# Patient Record
Sex: Female | Born: 1940 | Race: White | Hispanic: No | Marital: Married | Smoking: Never smoker
Health system: Southern US, Community
[De-identification: ages and names within clinical notes are randomized; demographics above are authoritative.]

## PROBLEM LIST (undated history)

## (undated) DIAGNOSIS — M48061 Spinal stenosis, lumbar region without neurogenic claudication: Secondary | ICD-10-CM

## (undated) DIAGNOSIS — M797 Fibromyalgia: Secondary | ICD-10-CM

## (undated) DIAGNOSIS — I10 Essential (primary) hypertension: Secondary | ICD-10-CM

## (undated) DIAGNOSIS — R413 Other amnesia: Secondary | ICD-10-CM

## (undated) HISTORY — PX: LUMBAR SPINE SURGERY: SHX701

## (undated) HISTORY — PX: CERVICAL SPINE SURGERY: SHX589

---

## 1998-11-09 ENCOUNTER — Encounter: Admission: RE | Admit: 1998-11-09 | Discharge: 1998-11-09 | Payer: Self-pay | Admitting: Neurological Surgery

## 1998-11-09 ENCOUNTER — Encounter: Payer: Self-pay | Admitting: Neurological Surgery

## 1998-11-18 ENCOUNTER — Encounter: Payer: Self-pay | Admitting: Neurological Surgery

## 1998-11-22 ENCOUNTER — Inpatient Hospital Stay (HOSPITAL_COMMUNITY): Admission: RE | Admit: 1998-11-22 | Discharge: 1998-11-27 | Payer: Self-pay | Admitting: Neurological Surgery

## 1998-11-22 ENCOUNTER — Encounter: Payer: Self-pay | Admitting: Neurological Surgery

## 1999-03-15 ENCOUNTER — Encounter: Payer: Self-pay | Admitting: Neurological Surgery

## 1999-03-15 ENCOUNTER — Ambulatory Visit (HOSPITAL_COMMUNITY): Admission: RE | Admit: 1999-03-15 | Discharge: 1999-03-15 | Payer: Self-pay | Admitting: Neurological Surgery

## 1999-05-04 ENCOUNTER — Encounter: Admission: RE | Admit: 1999-05-04 | Discharge: 1999-06-29 | Payer: Self-pay | Admitting: Neurological Surgery

## 2002-01-21 ENCOUNTER — Ambulatory Visit (HOSPITAL_COMMUNITY): Admission: RE | Admit: 2002-01-21 | Discharge: 2002-01-21 | Payer: Self-pay | Admitting: Family Medicine

## 2002-01-21 ENCOUNTER — Encounter: Payer: Self-pay | Admitting: Family Medicine

## 2003-07-08 ENCOUNTER — Ambulatory Visit (HOSPITAL_COMMUNITY): Admission: RE | Admit: 2003-07-08 | Discharge: 2003-07-08 | Payer: Self-pay | Admitting: Gastroenterology

## 2004-02-23 ENCOUNTER — Ambulatory Visit (HOSPITAL_COMMUNITY): Admission: RE | Admit: 2004-02-23 | Discharge: 2004-02-23 | Payer: Self-pay | Admitting: Family Medicine

## 2004-10-19 ENCOUNTER — Encounter: Admission: RE | Admit: 2004-10-19 | Discharge: 2004-10-19 | Payer: Self-pay | Admitting: Orthopaedic Surgery

## 2004-11-29 ENCOUNTER — Inpatient Hospital Stay (HOSPITAL_COMMUNITY): Admission: RE | Admit: 2004-11-29 | Discharge: 2004-12-04 | Payer: Self-pay | Admitting: Orthopaedic Surgery

## 2006-08-23 ENCOUNTER — Encounter: Admission: RE | Admit: 2006-08-23 | Discharge: 2006-08-23 | Payer: Self-pay | Admitting: Obstetrics and Gynecology

## 2006-09-06 ENCOUNTER — Encounter: Admission: RE | Admit: 2006-09-06 | Discharge: 2006-09-06 | Payer: Self-pay | Admitting: Obstetrics and Gynecology

## 2006-09-28 ENCOUNTER — Inpatient Hospital Stay (HOSPITAL_COMMUNITY): Admission: EM | Admit: 2006-09-28 | Discharge: 2006-10-01 | Payer: Self-pay | Admitting: Emergency Medicine

## 2007-04-02 ENCOUNTER — Encounter: Admission: RE | Admit: 2007-04-02 | Discharge: 2007-04-02 | Payer: Self-pay | Admitting: Family Medicine

## 2007-11-06 ENCOUNTER — Encounter: Admission: RE | Admit: 2007-11-06 | Discharge: 2007-11-06 | Payer: Self-pay | Admitting: Obstetrics and Gynecology

## 2007-11-11 ENCOUNTER — Ambulatory Visit (HOSPITAL_BASED_OUTPATIENT_CLINIC_OR_DEPARTMENT_OTHER): Admission: RE | Admit: 2007-11-11 | Discharge: 2007-11-11 | Payer: Self-pay | Admitting: Orthopedic Surgery

## 2007-11-11 ENCOUNTER — Encounter (INDEPENDENT_AMBULATORY_CARE_PROVIDER_SITE_OTHER): Payer: Self-pay | Admitting: Orthopedic Surgery

## 2007-12-29 ENCOUNTER — Inpatient Hospital Stay (HOSPITAL_COMMUNITY): Admission: RE | Admit: 2007-12-29 | Discharge: 2007-12-30 | Payer: Self-pay | Admitting: Orthopaedic Surgery

## 2008-01-09 ENCOUNTER — Encounter: Admission: RE | Admit: 2008-01-09 | Discharge: 2008-01-09 | Payer: Self-pay | Admitting: Orthopaedic Surgery

## 2008-01-11 ENCOUNTER — Emergency Department (HOSPITAL_COMMUNITY): Admission: EM | Admit: 2008-01-11 | Discharge: 2008-01-11 | Payer: Self-pay | Admitting: Emergency Medicine

## 2008-08-06 ENCOUNTER — Encounter: Admission: RE | Admit: 2008-08-06 | Discharge: 2008-08-06 | Payer: Self-pay | Admitting: Orthopaedic Surgery

## 2008-12-09 ENCOUNTER — Encounter: Admission: RE | Admit: 2008-12-09 | Discharge: 2008-12-09 | Payer: Self-pay | Admitting: Obstetrics and Gynecology

## 2010-01-22 ENCOUNTER — Encounter: Payer: Self-pay | Admitting: Family Medicine

## 2010-02-03 ENCOUNTER — Other Ambulatory Visit: Payer: Self-pay | Admitting: Neurological Surgery

## 2010-02-03 DIAGNOSIS — M542 Cervicalgia: Secondary | ICD-10-CM

## 2010-02-03 DIAGNOSIS — M79603 Pain in arm, unspecified: Secondary | ICD-10-CM

## 2010-02-07 ENCOUNTER — Other Ambulatory Visit: Payer: Self-pay

## 2010-02-08 ENCOUNTER — Other Ambulatory Visit: Payer: Self-pay

## 2010-02-23 ENCOUNTER — Ambulatory Visit
Admission: RE | Admit: 2010-02-23 | Discharge: 2010-02-23 | Disposition: A | Discharge status: Home / Self Care | Payer: MEDICARE | Source: Ambulatory Visit | Attending: Neurological Surgery | Admitting: Neurological Surgery

## 2010-02-23 DIAGNOSIS — M542 Cervicalgia: Secondary | ICD-10-CM

## 2010-02-23 DIAGNOSIS — M79603 Pain in arm, unspecified: Secondary | ICD-10-CM

## 2010-03-20 ENCOUNTER — Other Ambulatory Visit: Payer: Self-pay | Admitting: *Deleted

## 2010-03-20 DIAGNOSIS — Z1231 Encounter for screening mammogram for malignant neoplasm of breast: Secondary | ICD-10-CM

## 2010-04-05 ENCOUNTER — Ambulatory Visit
Admission: RE | Admit: 2010-04-05 | Discharge: 2010-04-05 | Disposition: A | Discharge status: Home / Self Care | Payer: MEDICARE | Source: Ambulatory Visit | Attending: *Deleted | Admitting: *Deleted

## 2010-04-05 DIAGNOSIS — Z1231 Encounter for screening mammogram for malignant neoplasm of breast: Secondary | ICD-10-CM

## 2010-04-17 LAB — URINALYSIS, ROUTINE W REFLEX MICROSCOPIC
Bilirubin Urine: NEGATIVE
Glucose, UA: NEGATIVE mg/dL
Ketones, ur: NEGATIVE mg/dL
Nitrite: NEGATIVE
Protein, ur: 30 mg/dL — AB
Specific Gravity, Urine: 1.008 (ref 1.005–1.030)
Urobilinogen, UA: 1 mg/dL (ref 0.0–1.0)
pH: 8 (ref 5.0–8.0)

## 2010-04-17 LAB — CBC
MCHC: 33.5 g/dL (ref 30.0–36.0)
Platelets: 280 10*3/uL (ref 150–400)
RBC: 3.89 MIL/uL (ref 3.87–5.11)
WBC: 6 10*3/uL (ref 4.0–10.5)

## 2010-04-17 LAB — URINE CULTURE: Colony Count: >=100000

## 2010-04-17 LAB — DIFFERENTIAL
Basophils Relative: 0 % (ref 0–1)
Eosinophils Absolute: 0.1 10*3/uL (ref 0.0–0.7)
Monocytes Relative: 9 % (ref 3–12)
Neutro Abs: 4 10*3/uL (ref 1.7–7.7)
Neutrophils Relative %: 68 % (ref 43–77)

## 2010-04-17 LAB — URINE MICROSCOPIC-ADD ON

## 2010-04-17 LAB — BASIC METABOLIC PANEL
BUN: 15 mg/dL (ref 6–23)
CO2: 24 mEq/L (ref 19–32)
Calcium: 9.1 mg/dL (ref 8.4–10.5)
Creatinine, Ser: 0.59 mg/dL (ref 0.4–1.2)
GFR calc Af Amer: >60 mL/min (ref >60)

## 2010-05-16 NOTE — Op Note (Signed)
Sarah [name], Sarah [name]              ACCOUNT NO.:  [id number]   MEDICAL RECORD NO.:  [id number]          PATIENT TYPE:  INP   LOCATION:  3030                         FACILITY:  [facility]   PHYSICIAN:  Mark C. Ophelia Charter, M.D.    DATE OF BIRTH:  1940-11-11   DATE OF PROCEDURE:  12/29/2007  DATE OF DISCHARGE:                               OPERATIVE REPORT   PREOPERATIVE DIAGNOSIS:  Cervical spondylosis.   POSTOPERATIVE DIAGNOSIS:  Cervical spondylosis.   PROCEDURE:  C4-5, C5-6, C6-7 anterior cervical diskectomy and fusion,  allograft and plate.  VueLock 50-mm plate.   SURGEON:  Mark C. Ophelia Charter, MD   ASSISTANT:  Wende Neighbors, PA-C   ANESTHESIA:  GOT plus Marcaine skin local.   BRIEF HISTORY:  This 70 year old female has had extensive history of  degenerative disk disease, had previous lumbar TLIF procedure, and has  had progressive cervical spondylosis.  She had had left hand numbness  which has been persistent for months with gradual progression.  She  normally takes Ultram 2 tablets 3-4 times a day plus Neurontin.  She had  progressive left hand numbness and chronic neck pain with more pain on  the left than right.  Over the Christmas holiday, she had had increased  numbness to the left long finger.  The patient had multilevel  spondylitic changes with significant facet degeneration with the most  narrowing on the left at C4-C5 and C6-C7.   PROCEDURE:  After induction of general anesthesia, orotracheal  intubation, surgical time-out, checklist of procedure, preoperative  Ancef prophylaxis, standard prepping of the neck was performed after  arms were tucked at the sides with careful padding of the ulnar nerves.  Head halter traction had been applied with no weight and sterile skin  marker was used prior to Betadine and Vi-Drape application after the  area was squared with towels, sterile Mayo stand to the head, thyroid  sheet and drape.  Incision was planned directly over the C5-6  level.  Looking at the MRI scan in the operating room, the spondylosis showed  narrowing on the left at C6-7.  Incision was made starting at the  midline and extending to the left platysma.  It was divided in line with  the fibers.  Blunt dissection down to the prominent large spur of C5-6  was selected and the needle was placed in it.  With the patient having  narrowing at C6-7 on the left with increased symptoms, x-ray using the C-  arm confirmed that spot film with a short 25 needle was directed at the  C5-6 level.  Spurs were removed.  The patient had significant  spondylosis at C5-6 the most severe with foraminal narrowing.  C6 level  was done first with her left long finger symptoms and a diskectomy was  performed at this level.  There were minimal anterior spurs at this  level.  Diskectomy was performed.  Operative microscope was draped and  brought in and posterior and longitudinal ligament was taken down.  Cloward curettes were used to remove bone out the uncovertebral joints  on each side.  There  was foraminal narrowing most significant on the  left side with spurs removed which was causing nerve root compression.  Sizing showed the 6-mm bone graft was appropriate.  The teeth Cloward  retractors were removed from right and left and blades were placed at  the C5-6 level next.  Spurs were removed anteriorly so plate would sit  down and flush.  Diskectomy was performed and the operative microscope  was brought back in and significant spurs were removed.  There was about  a 1-mm space posteriorly.  At this level, the bur was used to remove  within the bone and then it was removed with 1-mm Kerrison.  There was  significant spurring on both right and left and part of the posterior  longitudinal ligament was calcified causing foraminal stenosis and this  had to be stripped off of the vertebrae for decompression.  Once the  dura was directly visualized all the way across, the interspace  sizing  showed that 6-mm graft was appropriate.  The endplates had been removed  using the Karlin curettes by hand, rasping, and then sizing 7 was too  tight, 6 gave a nice tight fit.  It was a cortical cancellous lordotic  graft also.  Holes were drilled for planned plate.  It was noted at this  time that, I discussed with the patient that C4-5 was the planned level  in addition to the C5-6.  The patient's preoperative symptoms on the  left and MRI findings with narrowing at C6-C7 as well as C4-C5, it was  felt best to include C4-C5 level as well and the 3-level fusion was  performed rather than the planned 2-level fusion.  Operative microscope  was brought in for this level and rather than a 32-mm plate, a 04-VW  plate was selected.  There was foraminal stenosis more on the left than  right at the C4-5 level, but certainly not as severe as the C5-6 level.  After irrigation with saline solution, trial sizers showed again a 6-mm  lordotic graft gave a tight fit.  It was placed and bone grafts at each  level were rehydrated and syringe tapping, removing the air, marking it  with a skin marker in the midline to make sure the graft was put in a  good position.  After placement, a third graft plate was applied using  the same screw holes in the bottom 3 vertebrae and 2 new drill holed in  C4 vertebra.  Spot fluoro pictures were taken showing good position on  the AP and good position laterally.  Wound closure was performed with  Hemovac placed through separate stab incision in-line with the skin  incision.  Using the trocar, the platysma was closed with 3-0 Vicryl and  4-0 Vicryl subcuticular closure.  Tincture of benzoin, Steri-Strips,  postop dressing, and soft cervical collar was applied.  The patient  tolerated the procedure well and was transferred to the recovery room in  stable condition.  Foley catheter was inserted at the end of the case  just prior to extubation.  The patient was  neurologically intact in the  recovery room.      Mark C. Ophelia Charter, M.D.  Electronically Signed     MCY/MEDQ  D:  12/29/2007  T:  12/30/2007  Job:  [id number]

## 2010-05-16 NOTE — Cardiovascular Report (Signed)
NAME:  Sarah [name] NO.:  [id number]   MEDICAL RECORD NO.:  [id number]          PATIENT TYPE:  OBV   LOCATION:  6531                         FACILITY:  [facility]   PHYSICIAN:  Francisca December, M.D.  DATE OF BIRTH:  1940/01/30   DATE OF PROCEDURE:  DATE OF DISCHARGE:                            CARDIAC CATHETERIZATION   PROCEDURES PERFORMED:  1. Left heart catheterization.  2. Left ventriculogram.  3. Coronary angiography.  4. Intravascular ultrasound.  5. Percutaneous coronary intervention/drug-eluting stent implantation,      mid anterior descending artery.   INDICATIONS:  Sarah [name] is a 70 year old woman who had a prolonged  episode of unstable angina.  The CK-MBs were mildly elevated.  She has  an abnormal EKG with inferior Q waves present.  She has multiple risk  factors for coronary heart disease.  She is brought to the  catheterization laboratory at this time to identify the extent of  disease and provide further therapeutic options.   PROCEDURAL NOTE:  The patient was brought to cardiac catheterization  laboratory in the fasting state.  The right groin was prepped and draped  in the usual sterile fashion.  Local anesthesia was obtained with the  infiltration of 1% lidocaine.  A 6-French catheter sheath was inserted  percutaneously into the right femoral artery utilizing an anterior  approach over a guiding J-wire.  Left heart catheterization and coronary  angiography then proceeded in the standard fashion using 6-French #4  left and right Judkins catheters and a 110-cm pigtail catheter.  A 30-  degree RAO cine left ventriculogram was performed utilizing a power  injector,  mL were injected at 12 mL/sec.   I then proceeded with intravascular ultrasound.  The patient received a  total of 4500 units of heparin.  A 6-French #4 left Judkins guiding  catheter was advanced to the ascending aorta, where the left coronary os  was engaged.  A 0.014-inch  Luge intracoronary guidewire was advanced  into a diagonal branch initially.  Intravascular ultrasound was  performed with the Scimed Atlantis catheter.  A single mechanical  pullback was obtained.  The wire was then withdrawn and advanced into  the anterior descending artery.  Intravascular ultrasound again was  obtained with a mechanical pullback device.  The guidewire and  ultrasound catheter were then removed and the images analyzed.  I then  elected to proceed with intracoronary intervention.  The patient  received a double bolus and constant infusion of Integrilin.  The  resultant ACT was 331 seconds.  The guiding catheter was exchanged for a  7-French #3.5 CLS catheter.  A 0.014-inch Luge was advanced into the  diagonal branch.  A Whisper wire was advanced down the LAD.  Initial  dilatation of the diagonal branch at the ostium was performed with am  AngioSculpt PTCA catheter.  This was a 2.5/15-mm device.  It was  carefully positioned and inflated to 8 atmospheres for 55 seconds.  This  balloon was deflated and removed and a 2.0/20 mm Taxus Express II  intracoronary stent was advanced into position in the anterior  descending artery  and inflated there to a peak pressure of 14  atmospheres for 28 seconds.  This did cover the ostium of the diagonal  branch.  The diagonal wire was withdrawn and then advanced back into the  diagonal through the stent strut.  Balloon dilatation was performed  there with a 2.5/15 mm Scimed Maverick intracoronary balloon.  This was  positioned in the ostium and inflated to 6 atmospheres for 60 seconds.  This balloon was deflated and removed and a 3.0/15 mm Quantum Maverick  was advanced down the anterior descending artery.  It was deflated in a  single position to 12 atmospheres for 61 seconds.  Finally a kissing  balloon maneuver was performed with the 2.5/15 mm Maverick balloon in  the diagonal branch and the Quantum Maverick in the LAD.  The Maverick   balloon in the diagonal was inflated to 6 atmospheres in the Quantum  inflated to 9 atmospheres.  Following these maneuvers, there was  excellent patency in the LAD and excellent patency in the diagonal  branch.  A small, non-propagating superior dissection of the diagonal  branch at the ostium was seen at completion.  There was a good step-up  and step-down in the stented segment of the anterior descending artery.   The guiding catheter and guidewires were removed.  A right femoral  arteriogram in the 45-degree RAO angulation via the catheter sheath by  hand injection documented adequate anatomy for placement of percutaneous  closure device Angio-Seal.  This was subsequently deployed with good  hemostasis and an intact distal pulse.   HEMODYNAMICS:  Systemic arterial pressure was 136/67 with a mean of 95  mmHg.  There was no systolic gradient across the aortic valve.  The left  ventricular end-diastolic pressure was 8 mmHg pre ventriculogram.   ANGIOGRAPHY:  The left ventriculogram demonstrated normal chamber size  and normal global systolic function without regional wall motion  abnormality.  Ventricular tachycardia was induced during the injection.  A visual estimate of the ejection fraction is 65%.  There is left  coronary calcification seen.  No significant mitral regurgitation is  present.  The aortic valve is trileaflet and opened normally during  systole.   There is a right-dominant coronary system present.  The left main  coronary artery was normal.   The left anterior descending artery and its branches were highly  diseased; there was an eccentric stenosis at the origin of the diagonal  branch.  The origin of the diagonal branch appeared to be at least 50%  stenotic.  The anterior descending artery itself, especially in the RAO  views, appeared to have a 70% stenosis just after the origin of the  diagonal branch.  The LAD reaches but does not traverse the apex.  The   diagonal branch other than the ostial lesion has no significant  obstruction.   The left circumflex coronary artery and its branches show a ramus  intermedius and a large first marginal branch.  There is then a moderate-  sized obtuse marginal branch.  The ramus intermedius has a 30-50%  stenosis in the proximal segment.  Otherwise, there are no significant  obstructions in the circumflex system.   The right coronary artery is large and dominant.  There is no  obstruction seen in the proximal, mid or distal portion.  There is a  large poster descending artery and that reaches the apex.  There is no  obstruction in the posterior descending artery.  There is a moderate-  sized posterolateral branch with a single, small left ventricular  branch.  No obstruction is seen in this vessel.   Following balloon dilatation and stent implantation in the anterior  descending artery, there was no residual stenosis.  There was a 10-20%  residual stenosis in the diagonal branch ostium.   INTRAVASCULAR ULTRASOUND:  This demonstrated a distal reference vessel  size of 2.5 x 3.0 mm in the anterior descending artery.  The proximal  vessel reference size was 2.4 x 3.1 mm.  The luminal reference size in  the diagonal was 2.2 x 2.6 mm.  At the site of greatest stenosis in the  anterior descending artery, it was 1.4 x 1.9 mm with an area of 2.4 sq.  Mm.   FINAL IMPRESSION:  1. Atherosclerotic coronary vascular disease, single vessel.  2. Status post successful percutaneous coronary intervention/drug-      eluting stent implantation, anterior descending artery, with      cutting balloon dilatation of the diagonal branch ostium.  3. Intact left ventricular size and global systolic function, ejection      fraction 65%.      Francisca December, M.D.  Electronically Signed     JHE/MEDQ  D:  09/30/2006  T:  09/30/2006  Job:  [id number]   cc:   Molly Maduro A. Nicholos Johns, M.D.

## 2010-05-16 NOTE — Cardiovascular Report (Signed)
NAME:  Sarah [name], BEZOLD NO.:  [id number]   MEDICAL RECORD NO.:  [id number]          PATIENT TYPE:  OBV   LOCATION:  3710                         FACILITY:  [facility]   PHYSICIAN:  Francisca December, M.D.  DATE OF BIRTH:  28-Feb-1940   DATE OF PROCEDURE:  DATE OF DISCHARGE:                            CARDIAC CATHETERIZATION   REASON FOR CONSULTATION:  Chest pain.   HISTORY OF PRESENT ILLNESS:  Ms. Leya Paige is a pleasant 70-year-  old woman without prior cardiac history who yesterday had spontaneous  onset of anterior substernal chest pain she describes as rather sharp.  Within a few minutes, it had radiated up into the jaw bilaterally.  She  got up from the desk where she was sitting, took some antacids without  significant relief.  The EMS was called, and by the time she was being  loaded into the ambulance, the pain was starting to resolve.  They did  give her a nitroglycerin and en route, and the pain primarily resolved,  but she did develop quite a headache.  There was no associated nausea or  diaphoresis.  Her daughter said her hands were clammy.  No vomiting.  No  associated shortness of breath.  She has not had pain like this in the  past and has generally not had any symptoms of reflux.   PAST MEDICAL HISTORY:  1. Hypertension.  2. Questionable CVA 2004.  3. Spondylolisthesis.  4. Spinal stenosis status post restorative surgery.  5. Fibromyalgia.   CURRENT MEDICATIONS:  1. Neurontin 600 mg b.i.d.  2. Excedrin 350 mg once daily.  3. Lisinopril 20 mg p.o. daily.  4. Furosemide ?dose once daily.  5. Tramadol 50 mg p.o. t.i.d.  6. Percogesic 325/30 t.i.d.  7. Flexeril 10 mg p.o. daily.  8. Sennosides 25 mg once daily.   DRUG ALLERGIES:  NONE KNOWN.   SOCIAL HISTORY:  The patient is married, has three children.  No alcohol  or tobacco use.  Does not use excessive caffeine.  She is a [employer]  [employer], site.   FAMILY HISTORY:  Significant for coronary  artery disease in a younger  brother at the age less than 43.  Also, her father died of a ruptured  abdominal aortic aneurysm the age of 69.   REVIEW OF SYSTEMS:  Other than chronic low back pain and numbness in the  right leg, as well as symptoms typical for fibromyalgia, this is  negative.   PHYSICAL EXAMINATION:  VITAL SIGNS:  Blood pressure is varying between  90 and 116/50.  It was 170/96, upon initial arrival on the floor.  Heart  rate is 70 and regular, respiratory rate 18, temperature 97.7, O2  saturation on 2 liters is 97%/.  GENERAL:  This is a well-appearing, mildly obese 70 year old woman,  pleasant, conversant, in no distress, well kept.  HEENT:  Unremarkable.  NECK:  Supple without thyromegaly or masses.  The carotid upstrokes are  normal.  There is no bruit.  There is no JVD.  CHEST:  Clear with good excursion bilaterally.  No wheezes, rales, or  rhonchi.  HEART:  Has regular rhythm.  Normal S1 and S2 was heard.  No S3-S4  murmur, click or rub noted.  ABDOMEN:  Flat, soft, nontender, no midline pulsatile mass.  No  hepatomegaly.  LOWER EXTREMITIES:  External genitalia is without lesions.  RECTAL:  Not performed.  EXTREMITIES:  Show full range of motion, no edema, intact distal pulses.  NEUROLOGICAL:  Cranial nerves II-XII are intact.  Motor and sensory  grossly intact.  Gait not tested.  SKIN:  Warm, dry and clear.   ACCESSORY CLINICAL DATA:  Admission hemogram 1 gram shows a mild anemia  at 11.7 grams, hemoglobin otherwise unremarkable.  Creatinine is 0.8.  Initial cardiac markers showed a CK-MB of 3.4, a troponin of less than  0.05.  D-dimer was 0.37.  Subsequent to this, she has had CK-MB and  troponins done in the laboratory which showed initially a CK-MB mildly  elevated 6.6 with a total of 62, troponin 0.02.  The second set CK-MB  was 5.7, total 57, troponin 0.02, and the last set done this morning at  400:  CK-MB of 4, a total of 52 and troponin 0.02.    Electrocardiogram is abnormal and shows a loss of R-wave amplitude from  V2 to V3, suggestive anterior infarct.  She also has inferior Q-waves,  lead II and aVF.  Near diagnostic for inferior infarction.  There is  also left atrial enlargement.   CHEST X-RAY ON ADMISSION:  No acute findings.   IMPRESSION:  1. I believe Ms. Mckoy has experienced an episode of unstable      angina, given radiation of the pain and into the jaws and her      multiple risk factors which include age greater 55 years, strong      family history, hypertension, as a correction.  She also has mildly      elevated CK-MB and an abnormal EKG.  2. Treated hypertension.  3. Fibromyalgia.  4. 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  5. Hypertension on admission.   PLAN:  1. We will add Lovenox to the regimen of aspirin and topical      nitroglycerin.  2. I have consulted her to undergo, and she has accepted cardiac      catheterization, coronary angiography, possible PCI.  Goals, risks      and alternatives were discussed.  The patient states her      understanding, has had her questions answered, and wishes to      proceed.   Thank you very much for allowing me to assist in the care of Nile Dear.  It has been a pleasure to do so.  I will discuss her further  care with you.      Francisca December, M.D.  Electronically Signed     JHE/MEDQ  D:  09/29/2006  T:  09/29/2006  Job:  [id number]   cc:   Molly Maduro A. Nicholos Johns, M.D.

## 2010-05-16 NOTE — Op Note (Signed)
Sarah [name], BUCKALEW NO.:  [id number]   MEDICAL RECORD NO.:  [id number]          PATIENT TYPE:  AMB   LOCATION:  DSC                          FACILITY:  [facility]   PHYSICIAN:  Cindee Salt, M.D.       DATE OF BIRTH:  1940-10-29   DATE OF PROCEDURE:  11/11/2007  DATE OF DISCHARGE:                               OPERATIVE REPORT   PREOPERATIVE DIAGNOSIS:  Stenosing tenosynovitis, left middle finger.   POSTOPERATIVE DIAGNOSIS:  Stenosing tenosynovitis, left middle finger.   OPERATION:  Release of A1 pulley, left middle finger with excision of  volar flexor sheath cyst.   SURGEON:  Cindee Salt, MD   ASSISTANT:  Carolyne Fiscal RN   ANESTHESIA:  Forearm-based IV regional.   ANESTHESIOLOGIST:  Dr. Jean Rosenthal.   HISTORY:  The patient is a 70 year old female with a history of  triggering of the left middle finger.  This has not responded to  conservative treatment.  She was elect to undergo release of the A1  pulley.  She is aware of risks and complications including infection,  recurrence, injury to arteries, nerves, and tendons, incomplete relief  of symptoms, and dystrophy.  Preoperative area, the patient is seen.  The extremity marked by both the patient and surgeon.  Questions were  encouraged and answered.  Antibiotic was given.   PROCEDURE:  The patient was brought to the operating room where a  forearm-based IV regional anesthetic was carried out without difficulty.  She was prepped using DuraPrep, supine position, left arm free.  Time-  out was taken.  After adequate anesthesia was afforded to the patient,  an oblique incision was made over the A1 pulley of left middle finger  carried down through subcutaneous tissue.  Bleeders were  electrocauterized, bipolar.  The dissection carried down to the flexor  sheath.  A cyst was immediately encountered.  This was excised and sent  to pathology.  The A1 pulley was then released in its radial aspect.  A  small incision was made  centrally in the A2 pulley.  No further lesions  were identified.  Finger was placed through a full range of motion, no  further triggering was noted.  The wound was irrigated.  The skin closed  with interrupted 5-0 Vicryl Rapide sutures.  A sterile compressive  dressing was applied.  The patient tolerated the procedure well and was  taken to the recovery room for observation in satisfactory condition.  She will be discharged home and to return to the [facility] in 1 week, on Vicodin.          ______________________________  Cindee Salt, M.D.    GK/MEDQ  D:  11/11/2007  T:  11/11/2007  Job:  [id number]   cc:   Molly Maduro A. Nicholos Johns, M.D.

## 2010-05-16 NOTE — Discharge Summary (Signed)
[name], [name] NO.:  [id number]   MEDICAL RECORD NO.:  [id number]          PATIENT TYPE:  INP   LOCATION:  6531                         FACILITY:  [facility]   PHYSICIAN:  Barnetta Chapel, MDDATE OF BIRTH:  [birth year]   DATE OF ADMISSION:  09/28/2006  DATE OF DISCHARGE:  10/01/2006                               DISCHARGE SUMMARY   PRIMARY CARE PHYSICIAN:  Sarah [name], M.D.   DISCHARGE DIAGNOSES:  1. Chest pain, secondary to acute coronary syndrome.  2. Hypertension.  3. History of fibromyalgia.   DISCHARGE MEDICATIONS:  1. Toprol XL 150 mg p.o. once daily.  2. EC aspirin 325 mg p.o. once daily.  3. Plavix 75 mg p.o. once daily.  4. Sublingual nitroglycerin 0.4 mg p.r.n.  5. Neurontin 650 mg p.o. twice daily.  6. Lisinopril 20 mg once daily.  7. Tramadol 50 mg p.o. t.i.d.  8. Percogesic 325 mg t.i.d.  9. Flexeril 10 mg p.o. once daily.  10.Sennoside 25 mg p.o. once daily.   CONSULTATIONS:  Cardiology consult. The patient was seen by Dr. Amil Amen.  Dr. Amil Amen advised that the patient should have a cardiac  catheterization. Cardiac catheterization was done on September 30, 2006.  80% mid LAD lesion as well as 80% diagonal lesions were found. The mid  LAD lesion was stented. The diagonal lesion was dilated using balloon.   BRIEF HISTORY AND HOSPITAL COURSE:  Please refer to the history and  physical done on September 28, 2006. The patient is a 70 year old female  with a past medical history significant for hypertension, __________  severe, spondylolisthesis, spinal stenosis, status post back surgery and  fibromyalgia. The patient was admitted with chest pain.   The patient was admitted to the telemetry floor. A cardiology consult  was called and the patient was seen by Dr. Amil Amen. Dr. Amil Amen advised  a cardiac cath. The cardiac cath was done on September 30, 2006 which  revealed the above lesions. The patient has remained chest pain free.  She  will be discharged back home on aspirin, Plavix, ACE inhibitor and  beta blockers.   PLAN:  1. Discharge patient home today.  2. Follow with the cardiology team.  3. Follow with the primary care Sarah [name], Dr. Elias [name], in a week.  4. Cardiac diet.  5. Activity as per the cardiologist.      Barnetta Chapel, MD  Electronically Signed     SIO/MEDQ  D:  10/01/2006  T:  10/01/2006  Job:  [id number]   cc:   Sarah [name], M.D.  Sarah [name], M.D.

## 2010-05-16 NOTE — H&P (Signed)
NAME:  Sarah [name], Sarah [name] NO.:  [id number]   MEDICAL RECORD NO.:  [id number]          PATIENT TYPE:  EMS   LOCATION:  MAJO                         FACILITY:  [facility]   PHYSICIAN:  Barnetta Chapel, MDDATE OF BIRTH:  [birth year]   DATE OF ADMISSION:  09/28/2006  DATE OF DISCHARGE:                              HISTORY & PHYSICAL   PRIMARY CARE PHYSICIAN:  Robert A. Nicholos Johns, M.D.   CHIEF COMPLAINT:  Chest pain.   HISTORY OF PRESENT ILLNESS:  The patient is a 70 year old female with  past medical history significant for hypertension, questionable CVA,  spondylolisthesis, spinal stenosis status post back surgery, and  fibromyalgia.  The patient's brother has a history of coronary artery  disease and has had a CABG.  The patient presented with sudden onset of  lower sternal chest pain earlier in the day.  The pain was said to be  sharp-like and was radiating to the jaw.  The pain lasted for about 20  minutes.  The patient took some antiacids and felt much better.  According to the patient's daughter, nitroglycerin also improved the  chest pain.  No associated nausea or vomiting, no shortness of breath  and no diaphoresis.  Cardiac enzymes done so far are within normal  range.  EKG done revealed poor R wave progression and Q waves in leads  III and aVF.  No headache, no neck pain, no GI symptoms, and no urinary  symptoms.  No weight loss.   PAST MEDICAL HISTORY:  Essentially as above.   ALLERGIES:  NONE.   MEDICATIONS PRIOR TO ADMISSION:  (The patient is going to bring the  list, as she is not sure.)  However, she does remember tramadol,  aspirin, ibuprofen, lisinopril, and Neurontin.   SOCIAL HISTORY:  The patient is married with 3 children.  She denied any  history of alcohol use, cigarette use, or illicit drug use.   FAMILY HISTORY:  Significant for coronary artery disease and CABG.   REVIEW OF SYSTEMS:  Essentially as above.   PHYSICAL EXAMINATION:  VITAL  SIGNS:  Temperature 97.3, blood pressure  151/79 mmHg, heart rate 67 per minutes, respiratory rate 18 per minute.  GENERAL:  The patient is in no distress.  HEENT:  There is no pallor, there is no jaundice.  Extraocular muscles  are intact.  NECK:  Supple.  No JVD or lymphadenopathy.  LUNGS:  Clear to auscultation.  CARDIAC:  There is severe tenderness on palpation of the lower sternal  area; however, the patient feels this pain from the fibromyalgia and not  the same type of pain that she had earlier.  S1 and S2.  ABDOMEN:  Soft and benign.  No organomegaly.  Bowel sounds are present.  NEUROLOGIC:  Nonfocal.  EXTREMITIES:  No edema.   LABORATORY DATA:  White count 7.5, hemoglobin 11.7, hematocrit 35.1,  platelet count 236, MCV 88.4.  Sodium 136, potassium 4.8, chloride 104,  BUN 25, creatinine 0.8, blood sugar 79.   IMPRESSION:  1. Chest pain, rule out acute coronary syndrome.  2. Hypertension.  3. History of fibromyalgia.  Will admit the patient for observation.  Will get a cardiology consult.  Will cycle the patient's cardiac enzymes.  Will control the patient's  blood pressure.  We will rehydrate the patient.  Will control the  patient's pain.  Further management will depend on hospital course.      Barnetta Chapel, MD  Electronically Signed     SIO/MEDQ  D:  09/28/2006  T:  09/28/2006  Job:  [id number]   cc:   Elana Alm. Nicholos Johns, M.D.

## 2010-05-19 NOTE — H&P (Signed)
NAME:  Sarah [name], NUDELMAN NO.:  [id number]   MEDICAL RECORD NO.:  [id number]          PATIENT TYPE:  INP   LOCATION:  NA                           FACILITY:  [facility]   PHYSICIAN:  Sharolyn Douglas, M.D.        DATE OF BIRTH:  [birth year]   DATE OF ADMISSION:  11/29/2004  DATE OF DISCHARGE:                                HISTORY & PHYSICAL   CHIEF COMPLAINT:  Pain in my back and right leg.   HISTORY OF PRESENT ILLNESS:  A 70 year old female seen by [place] for continuing  and progressive problems concerning pain into her lumbar spine with  radiation in to the right lower extremity.  She has trochanteric bursitis on  that side, and cortisone injection was done by Dr. Noel Gerold on October 31, 2004, and this is better.  She is left with continuing radiation of pain  into the right lower extremity.  She is status post L3 to L5 lumbar  laminectomy done in 2000 by Dr. Danielle Dess.  She was having neurogenic  claudication at that time according to her history, and he relieved some of  her discomfort.  A dural tear was seen on the right.  Since that surgery,  she has tried various treatment modalities, anti-inflammatory therapy,  acupuncture, bracing of her spine, but unfortunately has not helped.  Fortunately, she has had no bowel or bladder dysfunction.  She has a loss of  reflexes on the right, both at the knee and the ankle.  She also has  positive straight leg raising with radiation to the back.  CT myelogram  shows a spondylolisthesis L3-4 and L4-5 with motion through flexion and  extension.  L5-S1 has moderately severe degenerative changes involving the  disk as well as the facet joints.  After much discussion including the risks  and benefits of surgery with both and her husband as well as explanation of  the surgery, it was decided to go ahead with surgical intervention, and the  patient is being admitted for L3 to S1 posterior spinal fusion.   PAST MEDICAL HISTORY:  This lady has been in  relatively good health  throughout her lifetime.  Her family physician is Dr. Elias Else of [facility].   1.  She had history of  stroke which was diagnosed while she was on a      mission trip in [place], [place].  No sequelae other than what she and      her husband report to be slight memory loss.  2.  Hypertension.  3.  Hemorrhoids.  4.  Intermittent constipation.   ALLERGIES:  No medical allergies.   CURRENT MEDICATIONS:  1.  Neurontin 300 mg.  She takes 3 in the morning and 4 in the evening.  2.  Ultram 50 mg  p.r.n. pain.  3.  Lasix 20 mg 1 q.a.m.  4.  Flexeril 10 mg 1 at dinner, 2 at bedtime.  5.  Cymbalta 60 mg 1 at dinner.  6.  Lisinopril 20 mg 1/2 tablet q.a.m.   PAST SURGICAL HISTORY:  1.  C section with tubal ligation done on March 16, 1971.  2.  Partial hysterectomy in 1981.  3.  Oophorectomy and salpingectomy in the past.  4.  Appendectomy in 1995.  5.  Back surgery by Dr. Danielle Dess, as mentioned above, for spinal stenosis.  6.  In 2004, she had some sort of bladder tack which was complicated by      previous sutures from hysterectomy.   FAMILY HISTORY:  Positive for heart disease and hypertension.   SOCIAL HISTORY:  She states she is happily married.  Has no intake of  alcohol or tobacco products.  Has three children.  Her husband will be care  giver after surgery.   REVIEW OF SYSTEMS:  CNS: No seizure, stroke, or paralysis, numbness, or  double vision other than as in HPI with radiculopathy into the right lower  extremity.  CARDIOVASCULAR: No chest pain, no angina or orthopnea.  RESPIRATORY: No productive cough, no hemoptysis. GASTROINTESTINAL: No  nausea, vomiting, melena, bloody stool. GENITOURINARY:  No discharge or  hematuria.  MUSCULOSKELETAL: Primarily as in History of Present Illness.   PHYSICAL EXAMINATION:  GENERAL: Alert, cooperative, friendly 70 year old  white female who is awake, alert, and oriented.  She is accompanied by her   husband, Sarah [name].  VITAL SIGNS: Blood pressure 130/68, pulse 74, respirations 12.  HEENT:  Normocephalic.  PERRLA.  EOM intact.  Oropharynx clear.  CHEST: Clear to auscultation.  No rhonchi, no rales.  HEART:  Regular rate and rhythm.  No murmurs are heard.  ABDOMEN: Soft, nontender.  Liver and spleen not felt.  GENITALIA/RECTAL/PELVIC'/BREASTS: Not done, not pertinent to present  illness.  EXTREMITIES:  As in History of Present Illness above.   ADMITTING DIAGNOSES:  1.  Spondylolisthesis and recurrent spinal stenosis L3 to S1.  2.  Hypertension.  3.  Questionable  history of stroke in the past.   PLAN:  The patient will undergo L3 to S1 posterior spinal fusion with  laminectomy utilizing pedicle screws, transforaminal lumbar interbody fusion  with allograft and bone morphogenic protein.  Today, this patient is fitted  with an EBI lumbosacral orthosis which will be used postoperatively.  This  History and Physical is performed in our office November 22, 2004.      Dooley L. Cherlynn June.      Sharolyn Douglas, M.D.  Electronically Signed    DLU/MEDQ  D:  11/22/2004  T:  11/22/2004  Job:  [id number]   cc:   Molly Maduro A. Nicholos Johns, M.D.  Fax: [phone]

## 2010-05-19 NOTE — H&P (Signed)
[place]  Patient:    Sarah [name]                      MRN: [id number] Proc. Date: 11/22/98 Adm. Date:  [id number] Attending:  Jonne Ply                         History and Physical  ADMITTING DIAGNOSIS:  Lumbar spondylosis and stenosis lumbar vertebrae-3/4 and lumbar vertebrae-4/5.  HISTORY OF PRESENT ILLNESS:  The patient is a 70 year old right-handed individual who has been having pain in her back with hip pain bilaterally and numbness in oth legs for a period of at least a year and probably somewhat longer.  He has had episodic back pain in the past, some occasional numbness, and this would usually resolve itself.  For the past year, she has had progressively worsening symptoms that have not been resolved.  Coughing and sneezing do not seem to aggravate her pain.  Bowel and bladder control has been compromised; however, the pain has been persistent. She finds that her stamina and ability to be up for any length of time has been severely limited.  She finds that she can walk approximately an eighth of a mile before she needs to stop and rest because numbness becomes so severe.  A MRI scan was obtained in 1997.  A more recent MRI dated October 04, 1998 was seen and evaluated in the office on November 09, 1998.  This study demonstrates that he has significant spondylitic disease with stenosis at L3/4 and L4/5 and to the lesser extent at L5-S1.  There is anterior lithiasis of L4 on L5.  Plain x-rays did not accompany her; however, we obtained some flexion and extension films in the  office and these demonstrate that the lithiasis appears to be fairly stable.  After careful consideration of her options, the patient was advised regarding surgery.  Review of the film from 1997 would suggest that there has been very minimal progression of her disease process but indeed the stenosis that she has is quite significant.  PAST  MEDICAL HISTORY:  Reveals that her general health has been fair.  She denies any significant medical problems.  She notes that she had a C-section in 1973.  Hysterectomy in 1981.  Cyst removed from her breast.  Benign tumor of the ovaries. Appendectomy in 1996.  She does not smoke.  She does not drink alcohol.  She has had a 16-pound weight gain over the past year.  Currently, states her height is 5 foot 3 and her weight is 218 pounds.  CURRENT MEDICATIONS: 1. Premarin 0.625 mg q.d. 2. Flexeril 10 mg q.d. 3. Xanax 0.25 mg q.d. 4. Ambien 5 mg. 5. Chlorothiazide 500 mg twice a weekly for swelling. 6. Vitamin C, vitamin E, vitamin B6 supplements, Centrum, calcium, and    ibuprofen.  SOCIAL HISTORY:  She is married.  She works as a Programmer, applications as a second Counsellor.  REVIEW OF SYSTEMS:  Notable for leg pain while walking, leg weakness, back pain, leg pain, and increased appetite.  She was reviewed and discussed with the patient.  PHYSICAL EXAMINATION:  GENERAL:  She is a moderately obese individual and can stand straight and erect.  MUSCULOSKELETAL:  Her flexion forward is limited to 45 degrees.  She extends 10  degrees and this aggravates the numbness in her feet.  Motor strength notes that the iliopsoas, quadriceps,  tibialis anterior, and gastrocnemius have good strength, tone, and bulk to confrontation.  Deep tendon reflexes are 2+ in the patellae, + in the Achilles.  Straight leg raising is negative bilaterally.  Patricks maneuver is negative bilaterally also.  Upper extremity strength and reflexes and normal and symmetric.  NEUROLOGICAL:  Cranial nerve examination reveals the pupils are 3 mm and briskly reactive to light and accommodation.  The extraocular movements are normal. The face is symmetric to grimace.  Tongue and uvula are in the midline.  Sclerae and conjunctivae are clear.  Oral mucosa is normal.  NECK:  No masses and no bruits are  heard.  LUNGS:  Clear to auscultation.  HEART:  Regular rate and rhythm.  No murmurs are heard.  ABDOMEN:  Soft.  Bowel sounds are positive.  No masses are palpable.  EXTREMITIES:  No clubbing, cyanosis, or edema.  IMPRESSION:  The patient has evidence of significant spondylitic stenosis at lumbar vertebrae-3/4 and lumbar vertebrae-4/5.  She is now being admitted to undergo surgical decompression of the same. DD:  11/22/98 TD:  11/23/98 Job: [id number]

## 2010-05-19 NOTE — Discharge Summary (Signed)
[place]  Patient:    Sarah [name]                      MRN: [id number] Adm. Date:  [id number] Disc. Date: 11/27/98 Attending:  Jonne Ply                           Discharge Summary  REASON FOR ADMISSION:  Lumbar stenosis L3-4 and L4-5.  FINAL DIAGNOSES: 1. Lumbar stenosis L3-4 and L4-5. 2. Fibromyalgia.  HISTORY OF PRESENT ILLNESS:   Sarah [name] is a 70 year old woman with significant lumbar stenosis L3-4 and L4-5 levels.  HOSPITAL COURSE:  She was admitted to the hospital on the same day as procedure  basis on November 22, 1998 and underwent lumbar laminectomy for stenosis.  She ad a CSF leak at the time of surgery which was closed primarily.  The patient was mobilized, she was able to tolerate.  She had a Foley catheter placed initially, was gradually able to mobilize over November 24 and 25, but did so quite slowly, and needed physical therapy for assistance.  She was doing better on November 26 and was discharged home at that point in satisfactory and stable condition, having tolerated her operation and hospitalization well.  DISCHARGE MEDICATIONS: 1. Ibuprofen 800 mg up to t.i.d. 2. Percocet 5/325 one or two q.4h. p.r.n. pain.  DISCHARGE INSTRUCTIONS:  No lifting, bending, twisting, or driving.  Okay to be up in her brace, use her walker at home.  Keep the wound clean and dry.  Okay to shower, do not soak her incision.  FOLLOWUP:  In two weeks with Dr. Danielle Dess. DD:  11/27/98 TD:  11/27/98 Job: [id number]

## 2010-05-19 NOTE — Op Note (Signed)
Sarah [name], Sarah [name] NO.:  [id number]   MEDICAL RECORD NO.:  [id number]          PATIENT TYPE:  INP   LOCATION:  5040                         FACILITY:  [facility]   PHYSICIAN:  Sharolyn Douglas, M.D.        DATE OF BIRTH:  07/28/1940   DATE OF PROCEDURE:  11/29/2004  DATE OF DISCHARGE:                                 OPERATIVE REPORT   DIAGNOSES:  1.  Recurrent lumbar spinal stenosis.  2.  Lumbar spondylolisthesis, L3-4 and L4-5.  3.  Lumbar degenerative disk disease.  4.  Chronic back and right lower extremity pain.   PROCEDURES:  1.  Revision L3-4 and L4-5 lumbar laminectomy with decompression of the      thecal sac and nerve roots.  2.  Transforaminal lumbar interbody fusion L3-4 and L4-5 with placement of      two PEEK cages.  3.  Segmental pedicle screw instrumentation, L3 through L5, using the Abbott      spine system.  4.  Posterior spinal arthrodesis, L3 through L5.  5.  Local autogenous bone graft supplemented with bone morphogenic protein      and Grafton allograft.   SURGEON:  Sharolyn Douglas, M.D.   ASSISTANT:  Verlin Fester, P.A.   ANESTHESIA:  General endotracheal.   ESTIMATED BLOOD LOSS:  400 mL.   COMPLICATIONS:  None.   INDICATIONS:  The patient is a pleasant 70 year old female with chronic  progressive back and right greater than left leg pain.  She had had a  previous laminectomy by Dr. Danielle Dess.  Imaging studies show mobile  spondylolisthesis at L3-4 and L4-5 with degenerative changes and mild  retrolisthesis of L5 on S1.  MRI scan and CT myelogram demonstrate recurrent  lumbar spinal stenosis below the previous L3-4 and L4-5 laminectomy with  lateral recess and foraminal narrowing secondary to the spondylolisthesis.  There does not appear to be any spinal stenosis or nerve root impingement at  the L5-S1 level.  The patient has been unresponsive to all conservative  care.  She is taking escalating doses of pain medication, is on Neurontin,  and  has failed epidural injections.  She has elected at this time to undergo  revision lumbar decompression and fusion in hopes of improving her symptoms.  Risks and benefits were reviewed.   PROCEDURE:  The patient was identified in the holding area and taken to the  operating room, underwent general endotracheal anesthesia without  difficulty, given prophylactic IV antibiotics.  She was carefully turned  prone onto the AcroMed frame.  All bony prominences were padded, face and  eyes protected at all times.  Neuro monitoring was established in the form  of SSEPs and lower extremity EMGs.  The back was prepped and draped in the  usual sterile fashion.  The previous midline incision was utilized and  extended several centimeters in each direction.  Dissection was carried  sharply to the previous scar tissue down to the deep fascia.  The patient  had a deep adipose layer.  The deep fascia and scar was incised and  subperiosteal exposure was carried  out to the tips transverse process of L3,  L4, L5 bilaterally.  The bony anatomy was distorted due to the spondylosis.  Care was taken not to inadvertently enter the spinal canal through the  previous laminectomy.  Deep retractors were placed.  We then carefully  dissected out the margins of the previous laminectomy.  The decompression  was then extended out laterally flush with the pedicles on the right side.  There was a tremendous amount of scar tissue and bony overgrowth, which was  meticulously removed using loupes and headlight magnification.  We  identified the L3. L4 and L5 nerve root and radically decompressed them out  of the foramen.  We found that the L4 nerve root on the right side was  severely compressed within the foramen and the lateral recess due to the  superior facet of L5 pressing the nerve against the pedicle of L4.  The L5  nerve root was also found to be stenotic within the lateral recess.  Once we  were satisfied with the  decompression, we turned our attention to placing  pedicle screws using anatomic probing technique.  We utilized 6.5 mm pedicle  screws.  We had excellent screw purchase, and the bone quality was good.  Each pedicle hole was palpated before placing the screw and there were no  breaches.  The screws were stimulated using triggered EMGs there were no  deleterious changes.  At this time we turned our attention to performing a  transforaminal lumbar interbody fusion at L3-4 and L4-5.  The facet joints  were osteotomized.  The exiting and traversing nerve roots were identified  and protected at all times.  Free-running EMGs were monitored.  The disk  spaces were entered and then dilated up using intervertebral spreaders.  We  then completed a radical diskectomy and scraped the cartilaginous endplates  using curved curets.  The interspaces were packed with local bone graft  obtained from the laminectomy along with BMP sponges from the medium Infuse  kit.  We then inserted 9 mm PEEK cages which had been packed with BMP  sponges into both the L3-4 and L4-5 interspaces and then carefully tamped  them anteriorly and across the midline.  Again there were no deleterious  changes in the free-running EMGs.  We then completed the posterior spinal  arthrodesis by decorticating the transverse processes of L3, L4 and L5  bilaterally and packed the remaining local bone graft into the lateral  gutters, supplementing this with 15 mL of Grafton allograft.  Seventy  millimeter titanium rods were then placed into the polyaxial screw heads and  compression was applied across both segments before shearing off the locking  caps.  A cross-connector was placed.  Intraoperative x-ray was taken showing  adequate position of the screws and PEEK spacers with excellent reduction of  the spondylolisthesis.  The wound was irrigated.  A deep Hemovac drain was  left in place.  Deep fascia closed with a running #1 Vicryl  suture. Subcutaneous layer closed with 2-0 Vicryl and 0 Vicryl.  The skin edges were  approximated using a running 3-0 nylon suture.  The patient was turned  supine, extubated without difficulty, and transferred to recovery in stable  condition able to move her upper and lower extremities.      Sharolyn Douglas, M.D.  Electronically Signed     MC/MEDQ  D:  11/29/2004  T:  11/30/2004  Job:  [id number]

## 2010-05-19 NOTE — Op Note (Signed)
NAME:  Sarah [name], Sarah [name]                        ACCOUNT NO.:  [id number]   MEDICAL RECORD NO.:  [id number]                   PATIENT TYPE:  AMB   LOCATION:  ENDO                                 [facility]AC[facility]  [facility]   PHYSICIAN:  James L. Malon Kindle., M.D.          DATE OF BIRTH:  [birth year]   DATE OF PROCEDURE:  07/08/2003  DATE OF DISCHARGE:                                 OPERATIVE REPORT   PROCEDURE:  Colonoscopy.   MEDICATIONS:  1. Fentanyl 75 mcg.  2. Versed 7 mg IV.   SCOPE:  Olympus pediatric adjustable colonoscope.   INDICATION:  Colon cancer screening.   DESCRIPTION OF PROCEDURE:  The procedure had been explained to the patient  and consent obtained.  The patient in the left lateral decubitus position,  the Olympus scope was inserted and advanced under direct visualization.  Prep was excellent.  We were able to advance fairly easily to the cecum.  The ileocecal valve and appendiceal orifice were seen.  The scope was  withdrawn and the colonic mucosa carefully examined.  The cecum, ascending  colon, transverse colon, descending, and sigmoid were seen well.  There were  scattered diverticula in the sigmoid, and no polyps or other lesions were  seen.  The mucosa was normal.  The rectum was free of polyps.  The scope was  withdrawn.  The patient tolerated the procedure well.   ASSESSMENT:  Normal screening colonoscopy.  V76.51.   PLAN:  We will recommend yearly Hemoccults, repeat colonoscopy in 10 years.                                               James L. Malon Kindle., M.D.    Waldron Session  D:  07/08/2003  T:  07/08/2003  Job:  [id number]   cc:   Molly Maduro A. Nicholos Johns, M.D.  [address]  [place]  [place] [address]  Fax: [phone]

## 2010-05-19 NOTE — Op Note (Signed)
[facility]  Patient:    Sarah [name]                      MRN: [id number] Proc. Date: 11/22/98 Adm. Date:  [id number] Attending:  Jonne Ply                           Operative Report  PREOPERATIVE DIAGNOSIS:  Lumbar stenosis L3-4 and L4-5.  POSTOPERATIVE DIAGNOSIS:  Lumbar stenosis L3-4 and L4-5.  OPERATION PERFORMED:  Lumbar laminectomy L3 and L4, partial L5.  SURGEON:  Stefani Dama, M.D.  ASSISTANT:  Alanson Aly. Roxan Hockey, M.D.  ANESTHESIA:  General endotracheal.  INDICATIONS FOR PROCEDURE:  The patient is a 70 year old individual who has had  significant symptoms of neurogenic claudication related to severe lumbar stenosis with degenerative listhesis at L3-4 and L4-5.  After carefully considering her options, she was advised regarding surgery.  She is now taken to the operating room.  DESCRIPTION OF PROCEDURE:  The patient was brought to the operating room supine on the stretcher.  After smooth induction of general endotracheal anesthesia, she as turned prone.  The back was shaved, prepped with DuraPrep and draped in sterile  fashion.  A midline incision was created and carried down to the lumbordorsal fascia which was opened on either side of the midline exposing the first palpable spinous process which was noted to be that of L3.  Care should be taken in numbering sequences as two different numbering sequences have been used.  L3 in  this situation is used to designate the third movable lumbar spinous process from the sacrum.  In other numbering sequences, it has been labeled as L2 where there are only four lumbar type vertebrae.  The dissection was then carried out into he paraspinous soft tissues in a subperiosteal fashion, hemostasis being obtained s the dissection occurred.  Self-retaining McCullough retractor was placed into the wound.  Laminectomy of L3 was then undertaken, removing the inferior  marginal lamina of L3 out to the most superior portions of the lamina, but leaving a portion of the laminar arch intact.  L4 was then completely removed, removing both laminar arches and spinous process.  The bone centrally was noted to be very thickened,  causing a very tight stenosis in the central canal.  This was drilled down with the Midas Rex and the AM3 bur.  As the bone was ultimately thin, the laminectomy was widened slightly.  At this point a small dural ____________ occurred on the right side of the dural tube near the stenotic area.  A modest amount of spinal fluid had decompressed itself along with several loops of nerves from the common dural tube. These were placed back into the spinal canal and the tear was oversewn primarily with #6-0 Prolene suture.  The closure was effected without difficulty.  The laminectomy was then cautiously widened using a combination of hand tools and the Midas Rex and the AM-3 bur to remove thickened bone on the lateral margins. Markedly redundant yellow ligament in the lateral recesses was removed.  On the  right side, the L3, the L4 and the L5 nerve roots were each decompressed sequentially in their travel out the foramen.  Once this was secured, the left ide was decompressed in a similar fashion using the Midas Rex and the AM3 bur to remove the widened portions of the laminar bone out to the facet joint and  then using 2 and a 3 and a 4 mm Kerrison punch to open the foramen widely.  In the end, the common dural tube and the above named nerve roots were well decompressed. Hemostasis was obtained.  A Valsalva to 40 cmH2O revealed no leakage of spinal fluid.  The wound was then closed after copiously irrigating the wound with antibiotic bacitracin solution, using #1 Vicryl in interrupted fashion in the lumbodorsal fascia and 2-0 Vicryl in the subcutaneous tissue and 3-0 Vicryl subcuticularly.  The patient tolerated the procedure well.   Blood loss is estimated at 400 cc. DD:  11/22/98 TD:  11/25/98 Job: [id number]

## 2010-05-19 NOTE — Discharge Summary (Signed)
Sarah [name], Sarah [name] NO.:  [id number]   MEDICAL RECORD NO.:  [id number]          PATIENT TYPE:  INP   LOCATION:  5040                         FACILITY:  [facility]   PHYSICIAN:  Sharolyn Douglas, M.D.        DATE OF BIRTH:  01/10/1940   DATE OF ADMISSION:  11/29/2004  DATE OF DISCHARGE:  12/04/2004                                 DISCHARGE SUMMARY   ADMITTING DIAGNOSES:  1.  Spondylolisthesis and recurrent spinal stenosis L3-1.  2.  Hypertension.  3.  Questionable history of stroke in the past.   DISCHARGE DIAGNOSES:  1.  Spondylolisthesis and recurrent spinal stenosis L3-1.  2.  Hypertension.  3.  Questionable history of stroke in the past.  4.  Mild postoperative anemia.   OPERATION:  On November 29, 2004, the patient underwent:  1.  Revision L3 L4 and L4-5 lumbar laminectomy due to compression of thecal      sac and nerve roots.  2.  Transforaminal lumbar interbody fusion L3-L4 and L4-L5 with placement of      two PEEK cages.  3.  Segmental pedicle screw instrumentation L3-L5 utilizing Abbott spine      system.  4.  Posterior spinal arthrodesis L3-L5.  5.  Local autogenous bone graft supplemented with bone morphogenic protein      and [place] allograft, Colleen Mahar assisted.   BRIEF HISTORY:  This 70 year old lady with pain concerning her low back with  radiation to right lower leg.  Thought early on she might have had  trochanteric bursitis in the area, we injected with cortisone which improved  it.  Unfortunately, continued with pain into the right lower extremity.  She  had had a previous surgery in 2000 by Dr. Danielle Dess but has continued with pain  and discomfort.  Loss of reflexes on the right both at the knee and ankle  was noted and a positive straight leg raise on that same side.  CT myelogram  showed spondylolisthesis L3 L4 and L4 L5 with motion.  After much discussion  including the risks and benefits, it was decided the patient would benefit  from  surgical intervention and was admitted for the above procedure.   COURSE IN THE HOSPITAL:  The patient tolerated the surgical procedure quite  well.  She began with physical therapy, increasing her level of activity,  out of bed to the chair.  The dressing was changed, the wound was dry.  The  patient was fitted in a rigid back brace which was used postoperatively for  stability of the lumbar and she was taught to __________ and don this by the  therapist.  The diet was advanced as she had a positive flatus sign and she  was able to ambulate, felt confident and p.o. analgesia was controlling her  discomfort.  She was voiding well.  It was decided she could be maintained  in her home environment with her family and home health and was readied for  discharge.   Laboratory values in the hospital hematologically showed a preoperative CBC  completely within normal limits, hemoglobin was 13.5,  hematocrit was 39.0.  Final hemoglobin was 9.6, hematocrit 27.6.  Blood chemistries were normal  other than very mild hyponatremia postoperatively and urinalysis negative  for urinary tract infection.  Electrocardiogram showed normal sinus rhythm  with possible anterior infarct age undetermined.  No chest x-ray seen on  this chart.   CONDITION ON DISCHARGE:  Improved, stable.   PLAN:  The patient is discharged to her home, return to see [place] in about 2  weeks after date of surgery.  Continue with a regular diet.  She is to walk  with assistance, no driving for 2 weeks, no lifting for 12 weeks.  Use  walker for activities.   DISCHARGE MEDICATIONS:  Percocet for pain, Robaxin q.6 hours p.r.n. muscle  spasm, one multivit daily, Calcium 1000-1200 mg daily, Colace 100 mg two  times daily, use laxative as needed and continue with home medications and  diet.      Dooley L. Cherlynn June.      Sharolyn Douglas, M.D.  Electronically Signed    DLU/MEDQ  D:  02/12/2005  T:  02/12/2005  Job:  [id number]   cc:    Sharolyn Douglas, M.D.  Fax: [phone]   Elana Alm. Nicholos Johns, M.D.  Fax: [phone]

## 2010-06-20 ENCOUNTER — Inpatient Hospital Stay (HOSPITAL_BASED_OUTPATIENT_CLINIC_OR_DEPARTMENT_OTHER)
Admission: RE | Admit: 2010-06-20 | Discharge: 2010-06-20 | Disposition: A | Discharge status: Home / Self Care | Payer: MEDICARE | Source: Ambulatory Visit | Attending: Cardiology | Admitting: Cardiology

## 2010-06-20 DIAGNOSIS — Z9861 Coronary angioplasty status: Secondary | ICD-10-CM | POA: Insufficient documentation

## 2010-06-20 DIAGNOSIS — T82897A Other specified complication of cardiac prosthetic devices, implants and grafts, initial encounter: Secondary | ICD-10-CM | POA: Insufficient documentation

## 2010-06-20 DIAGNOSIS — I2510 Atherosclerotic heart disease of native coronary artery without angina pectoris: Secondary | ICD-10-CM | POA: Insufficient documentation

## 2010-06-20 DIAGNOSIS — R079 Chest pain, unspecified: Secondary | ICD-10-CM | POA: Insufficient documentation

## 2010-06-20 DIAGNOSIS — Y831 Surgical operation with implant of artificial internal device as the cause of abnormal reaction of the patient, or of later complication, without mention of misadventure at the time of the procedure: Secondary | ICD-10-CM | POA: Insufficient documentation

## 2010-06-27 NOTE — Cardiovascular Report (Signed)
NAME:  Sarah [name], Sarah [name] NO.:  [id number]  MEDICAL RECORD NO.:  [id number]  LOCATION:                                 FACILITY:  PHYSICIAN:  Armanda Magic, M.D.     DATE OF BIRTH:  22-Aug-1940  DATE OF PROCEDURE:  06/20/2010 DATE OF DISCHARGE:                           CARDIAC CATHETERIZATION   PROCEDURE:  Left heart catheterization, coronary angiography, and left ventriculography.  OPERATOR:  Armanda Magic, MD  INDICATIONS:  Chest pain with transient ischemic dilatation noted on nuclear stress test despite normal myocardial perfusion.  The patient has a history of CAD with LAD stent in the past.  COMPLICATIONS:  None.  IV MEDICATIONS:  Versed 1 mg, fentanyl 25 mcg.  IV ACCESS:  Via right femoral artery 4-French sheath.  This is a 70 year old female who has a history of one-vessel CAD in the past with an LAD stent in the midportion of the LAD who presents with episodes of chest pain and normal myocardial perfusion, but transient ischemic dilatation on nuclear imaging.  She now presents for cardiac catheterization.  The patient was brought to the cardiac catheterization laboratory in a fasting nonsedated state.  Informed consent was obtained.  The patient was connected to continuous heart rate, pulse oximetry monitoring, and intermittent blood pressure monitoring.  The right groin was prepped and draped in sterile fashion.  Xylocaine 1% was used for local anesthesia. Using modified Seldinger technique, a 4-French sheath was placed in right femoral artery.  Under fluoroscopic guidance, a 4-French JL-4 catheter was placed in the vicinity of the left coronary ostium, but could not engage the ostium.  The catheter was exchanged out over a guidewire for a 4-French JL-3.5 catheter which again could not successfully engage the coronary ostium.  The catheter was exchanged out over a guidewire for a 4-French JL-5 catheter which successfully engaged the coronary  ostium.  Multiple cine films were taken at 30-degree RAO and 40-degree LAO views.  This catheter was then exchanged out over a guidewire for a 4-French 3-DRCA catheter which successfully engaged the right coronary ostium.  Multiple cine films were taken at 30-degree RAO and 40-degree LAO views.  This catheter was then exchanged out over a guidewire for a 4-French angled pigtail catheter which was placed in fluoroscopic guidance into the left ventricular cavity.  Left ventriculography was performed in the 30-degree RAO view using a total of 25 mL of contrast, 12 mL per second.  The catheter was then pulled back across the aortic valve with no significant gradient noted.  At the end of the procedure, all catheters and sheaths were removed.  Manual compression was performed until adequate hemostasis was obtained.  The patient was transferred back to room in stable condition.  RESULTS: 1. Left main coronary artery is widely patent and bifurcates into left     anterior descending artery and left circumflex artery. 2. The left anterior descending artery is widely patent in its     proximal portion and then gives rise to a first diagonal branch     which is moderate in size and widely patent.  Just distal to the     takeoff of the diagonal there  is evidence of a stent in the mid LAD     with 20% in-stent restenosis.  The ongoing LAD traversed the apex     and is widely patent. 3. The left circumflex is widely patent throughout its course in the     AV groove and gives rise to a very high first obtuse marginal     branch which is small to moderate in size with a 60% narrowing in     the proximal portion.  It gives rise to a small second obtuse     marginal branch which is widely patent. 4. The right coronary artery is widely patent throughout its course     and distally bifurcates into posterior descending artery and     posterolateral artery, both of which are widely patent. 5. Left  ventriculography shows normal LV function, EF 60%, LV pressure     140/15 mmHg, aortic pressure 140/60 mmHg, LVEDP 23 mmHg.  ASSESSMENT: 1. Patent left anterior descending stent with 20% in-stent restenosis. 2. Obtuse marginal 1 of 60%. 3. Normal left ventricular function. 4. Elevated left ventricular end-diastolic pressure consistent with     diastolic dysfunction.  PLAN:  Discharge to home after IV fluid and bedrest with medical management on aspirin, beta-blocker, statins, and Lasix.  The patient will follow up with my nurse practitioner in 2 weeks for groin check.     Armanda Magic, M.D.     TT/MEDQ  D:  06/20/2010  T:  06/21/2010  Job:  [id number]  Electronically Signed by Armanda Magic M.D. on 06/27/2010 10:02:57 PM

## 2010-10-03 LAB — POCT I-STAT, CHEM 8
Chloride: 102
Glucose, Bld: 91
HCT: 40
Hemoglobin: 13.6
Potassium: 4
Sodium: 143

## 2010-10-06 LAB — CBC
HCT: 40.5 % (ref 36.0–46.0)
Hemoglobin: 13.6 g/dL (ref 12.0–15.0)
RBC: 4.25 MIL/uL (ref 3.87–5.11)
RDW: 13.5 % (ref 11.5–15.5)
WBC: 5.7 10*3/uL (ref 4.0–10.5)

## 2010-10-06 LAB — COMPREHENSIVE METABOLIC PANEL
ALT: 31 U/L (ref 0–35)
Alkaline Phosphatase: 66 U/L (ref 39–117)
BUN: 22 mg/dL (ref 6–23)
CO2: 30 mEq/L (ref 19–32)
Chloride: 103 mEq/L (ref 96–112)
GFR calc non Af Amer: >60 mL/min (ref >60)
Glucose, Bld: 99 mg/dL (ref 70–99)
Potassium: 4.5 mEq/L (ref 3.5–5.1)
Sodium: 141 mEq/L (ref 135–145)
Total Bilirubin: 0.7 mg/dL (ref 0.3–1.2)
Total Protein: 6.6 g/dL (ref 6.0–8.3)

## 2010-10-06 LAB — PROTIME-INR
INR: 1 (ref 0.00–1.49)
Prothrombin Time: 13.6 seconds (ref 11.6–15.2)

## 2010-10-12 LAB — CBC
HCT: 31.8 — ABNORMAL LOW
HCT: 36.4
HCT: 36.5
Hemoglobin: 10.5 — ABNORMAL LOW
Hemoglobin: 12
Hemoglobin: 12.1
MCHC: 33
MCHC: 33.2
MCHC: 33.3
MCHC: 33.3
MCV: 88.4
MCV: 88.4
MCV: 88.7
MCV: 89.4
Platelets: 213
Platelets: 264
Platelets: 269
RBC: 3.59 — ABNORMAL LOW
RBC: 3.97
RBC: 4.07
RBC: 4.11
RDW: 13.9
RDW: 14
RDW: 14.2 — ABNORMAL HIGH
RDW: 14.3 — ABNORMAL HIGH
WBC: 5.3
WBC: 5.8
WBC: 5.9

## 2010-10-12 LAB — BASIC METABOLIC PANEL
BUN: 17
CO2: 27
Calcium: 8.7
Chloride: 100
Creatinine, Ser: 0.81
GFR calc Af Amer: >60
GFR calc non Af Amer: >60
Glucose, Bld: 93
Potassium: 3.8
Sodium: 137

## 2010-10-12 LAB — BASIC METABOLIC PANEL WITH GFR
BUN: 13
CO2: 27
Calcium: 8.5
Chloride: 105
Creatinine, Ser: 0.68
GFR calc non Af Amer: >60
Glucose, Bld: 136 — ABNORMAL HIGH
Potassium: 3.7
Sodium: 140

## 2010-10-12 LAB — DIFFERENTIAL
Basophils Absolute: 0
Basophils Relative: 1
Eosinophils Absolute: 0.1
Monocytes Absolute: 0.6
Monocytes Relative: 8
Neutro Abs: 3.6
Neutrophils Relative %: 49

## 2010-10-12 LAB — POCT CARDIAC MARKERS
CKMB, poc: 3.4
Myoglobin, poc: 128
Operator id: 270111
Troponin i, poc: <0.05

## 2010-10-12 LAB — CARDIAC PANEL(CRET KIN+CKTOT+MB+TROPI)
CK, MB: 4
CK, MB: 5.7 — ABNORMAL HIGH
Relative Index: INVALID
Relative Index: INVALID
Total CK: 52
Total CK: 57

## 2010-10-12 LAB — I-STAT 8, (EC8 V) (CONVERTED LAB)
Acid-Base Excess: 8 — ABNORMAL HIGH
BUN: 25 — ABNORMAL HIGH
Bicarbonate: 32.3 — ABNORMAL HIGH
Chloride: 104
Glucose, Bld: 79
HCT: 39
Hemoglobin: 13.3
Operator id: 270111
Potassium: 4.8
Sodium: 136
TCO2: 34
pCO2, Ven: 44 — ABNORMAL LOW
pH, Ven: 7.474 — ABNORMAL HIGH

## 2010-10-12 LAB — PROTIME-INR
INR: 1
Prothrombin Time: 13.5

## 2010-10-12 LAB — POCT I-STAT CREATININE: Creatinine, Ser: 0.8

## 2010-10-12 LAB — CK TOTAL AND CKMB (NOT AT ARMC)
CK, MB: 6.6 — ABNORMAL HIGH
Relative Index: INVALID
Total CK: 65

## 2011-06-24 ENCOUNTER — Emergency Department (HOSPITAL_COMMUNITY): Payer: Medicare Other

## 2011-06-24 ENCOUNTER — Inpatient Hospital Stay (HOSPITAL_COMMUNITY)
Admission: EM | Admit: 2011-06-24 | Discharge: 2011-06-26 | DRG: 689 | Disposition: A | Discharge status: Home / Self Care | Payer: Medicare Other | Source: Ambulatory Visit | Attending: Internal Medicine | Admitting: Internal Medicine

## 2011-06-24 DIAGNOSIS — G3184 Mild cognitive impairment, so stated: Secondary | ICD-10-CM | POA: Diagnosis present

## 2011-06-24 DIAGNOSIS — Z79899 Other long term (current) drug therapy: Secondary | ICD-10-CM

## 2011-06-24 DIAGNOSIS — IMO0001 Reserved for inherently not codable concepts without codable children: Secondary | ICD-10-CM | POA: Diagnosis present

## 2011-06-24 DIAGNOSIS — M48061 Spinal stenosis, lumbar region without neurogenic claudication: Secondary | ICD-10-CM

## 2011-06-24 DIAGNOSIS — G92 Toxic encephalopathy: Secondary | ICD-10-CM | POA: Diagnosis present

## 2011-06-24 DIAGNOSIS — N390 Urinary tract infection, site not specified: Principal | ICD-10-CM

## 2011-06-24 DIAGNOSIS — Z8249 Family history of ischemic heart disease and other diseases of the circulatory system: Secondary | ICD-10-CM

## 2011-06-24 DIAGNOSIS — R4182 Altered mental status, unspecified: Secondary | ICD-10-CM

## 2011-06-24 DIAGNOSIS — A498 Other bacterial infections of unspecified site: Secondary | ICD-10-CM | POA: Diagnosis present

## 2011-06-24 DIAGNOSIS — I10 Essential (primary) hypertension: Secondary | ICD-10-CM

## 2011-06-24 DIAGNOSIS — F03A0 Unspecified dementia, mild, without behavioral disturbance, psychotic disturbance, mood disturbance, and anxiety: Secondary | ICD-10-CM

## 2011-06-24 DIAGNOSIS — I2510 Atherosclerotic heart disease of native coronary artery without angina pectoris: Secondary | ICD-10-CM

## 2011-06-24 DIAGNOSIS — G9340 Encephalopathy, unspecified: Secondary | ICD-10-CM

## 2011-06-24 DIAGNOSIS — N289 Disorder of kidney and ureter, unspecified: Secondary | ICD-10-CM | POA: Diagnosis present

## 2011-06-24 DIAGNOSIS — Z7982 Long term (current) use of aspirin: Secondary | ICD-10-CM

## 2011-06-24 DIAGNOSIS — R410 Disorientation, unspecified: Secondary | ICD-10-CM

## 2011-06-24 DIAGNOSIS — M797 Fibromyalgia: Secondary | ICD-10-CM

## 2011-06-24 DIAGNOSIS — F0390 Unspecified dementia without behavioral disturbance: Secondary | ICD-10-CM | POA: Diagnosis present

## 2011-06-24 DIAGNOSIS — G929 Unspecified toxic encephalopathy: Secondary | ICD-10-CM | POA: Diagnosis present

## 2011-06-24 DIAGNOSIS — D649 Anemia, unspecified: Secondary | ICD-10-CM

## 2011-06-24 DIAGNOSIS — R0989 Other specified symptoms and signs involving the circulatory and respiratory systems: Secondary | ICD-10-CM

## 2011-06-24 HISTORY — DX: Essential (primary) hypertension: I10

## 2011-06-24 HISTORY — DX: Other amnesia: R41.3

## 2011-06-24 HISTORY — DX: Fibromyalgia: M79.7

## 2011-06-24 HISTORY — DX: Spinal stenosis, lumbar region without neurogenic claudication: M48.061

## 2011-06-24 LAB — DIFFERENTIAL
Basophils Absolute: 0 10*3/uL (ref 0.0–0.1)
Basophils Relative: 0 % (ref 0–1)
Monocytes Absolute: 1.4 10*3/uL — ABNORMAL HIGH (ref 0.1–1.0)
Neutro Abs: 7.3 10*3/uL (ref 1.7–7.7)

## 2011-06-24 LAB — CBC
HCT: 25.4 % — ABNORMAL LOW (ref 36.0–46.0)
MCHC: 32.7 g/dL (ref 30.0–36.0)
Platelets: 197 10*3/uL (ref 150–400)
RDW: 15.2 % (ref 11.5–15.5)

## 2011-06-24 LAB — BASIC METABOLIC PANEL
Calcium: 9 mg/dL (ref 8.4–10.5)
Chloride: 102 mEq/L (ref 96–112)
Creatinine, Ser: 1.59 mg/dL — ABNORMAL HIGH (ref 0.50–1.10)
GFR calc Af Amer: 37 mL/min — ABNORMAL LOW (ref >90)
Sodium: 134 mEq/L — ABNORMAL LOW (ref 135–145)

## 2011-06-24 MED ORDER — SODIUM CHLORIDE 0.9 % IV SOLN
INTRAVENOUS | Status: AC
  Administered 2011-06-24: via INTRAVENOUS

## 2011-06-24 NOTE — ED Notes (Addendum)
DR. Weldon Inches AT BEDSIDE EVALUATING PT. , FAMILY AT BEDSIDE. PT. IS ON MONITOR / OXYGEN 2 LPM / [place].

## 2011-06-24 NOTE — ED Notes (Addendum)
PT. CURRENTLY AT CT SCAN.

## 2011-06-24 NOTE — ED Notes (Signed)
The patient was at home today at 1230 when her family noted she had slurred speech, glazed over eyes, confusion, and incoherence.  Her family asked her to write her name, count to ten, and form sentence, but she was unable to do so.  The patient has a hx of CVA in 2004.

## 2011-06-24 NOTE — ED Provider Notes (Signed)
History     CSN: [id number]  Arrival date & time 06/24/11  2122   First MD Initiated Contact with Patient 06/24/11 2123      Chief Complaint  Patient presents with  . Altered Mental Status   Patient is a 71 y.o. female presenting with altered mental status. The history is provided by the patient, the spouse, a caregiver and a relative.  Altered Mental Status This is a new problem. The current episode started today (starting at 12:30 pm, patient developed slurred speech, confusion, dysarthria, and glazed over eyes, similar in appearance to her previous CVA (in 2004)). The problem occurs constantly. The problem has been gradually improving. Associated symptoms include weakness (generalized). Pertinent negatives include no abdominal pain, chest pain, chills, coughing, fever, headaches, nausea, neck pain, numbness, rash or vomiting. She has tried nothing for the symptoms.    No past medical history on file.  No past surgical history on file.  No family history on file.  History  Substance Use Topics  . Smoking status: Not on file  . Smokeless tobacco: Not on file  . Alcohol Use: Not on file    OB History    Grav Para Term Preterm Abortions TAB SAB Ect Mult Living                  Review of Systems  Constitutional: Negative for fever, chills, activity change and appetite change.  HENT: Negative for neck pain and neck stiffness.   Respiratory: Negative for cough, chest tightness, shortness of breath and wheezing.   Cardiovascular: Negative for chest pain and palpitations.  Gastrointestinal: Negative for nausea, vomiting, abdominal pain, diarrhea and constipation.  Genitourinary: Negative for dysuria, decreased urine volume and difficulty urinating.  Skin: Negative for rash and wound.  Neurological: Positive for speech difficulty, weakness (generalized) and light-headedness. Negative for dizziness, tremors, seizures, syncope, facial asymmetry, numbness and headaches.    Psychiatric/Behavioral: Positive for confusion, decreased concentration and altered mental status. Negative for agitation. The patient is not nervous/anxious.   All other systems reviewed and are negative.    Allergies  Review of patient's allergies indicates no known allergies.  Home Medications  No current outpatient prescriptions on file.  BP 123/47  Pulse 85  Temp 99.3 F (37.4 C) (Oral)  Resp 15  Ht 5\' 3"  (1.6 m)  Wt 200 lb (90.719 kg)  BMI 35.43 kg/m2  SpO2 99%  Physical Exam  Nursing note and vitals reviewed. Constitutional: She appears well-developed and well-nourished.  HENT:  Head: Normocephalic and atraumatic.  Right Ear: External ear normal.  Left Ear: External ear normal.  Nose: Nose normal.  Mouth/Throat: Oropharynx is clear and moist. No oropharyngeal exudate.  Eyes: Conjunctivae are normal. Pupils are equal, round, and reactive to light.  Neck: Normal range of motion. Neck supple.  Cardiovascular: Normal rate, regular rhythm, normal heart sounds and intact distal pulses.  Exam reveals no gallop and no friction rub.   No murmur heard. Pulmonary/Chest: Effort normal and breath sounds normal. No respiratory distress. She has no wheezes. She has no rales. She exhibits no tenderness.  Abdominal: Soft. Bowel sounds are normal. She exhibits no distension and no mass. There is no tenderness. There is no rebound and no guarding.  Musculoskeletal: Normal range of motion. She exhibits no edema and no tenderness.  Neurological: She is alert. She has normal strength. She displays no atrophy and no tremor. No cranial nerve deficit or sensory deficit. She exhibits normal muscle tone. She displays no  seizure activity. GCS eye subscore is 4. GCS verbal subscore is 4. GCS motor subscore is 6.  Reflex Scores:      Patellar reflexes are 2+ on the right side and 2+ on the left side.      Achilles reflexes are 2+ on the right side and 2+ on the left side.      Oriented x 1  GCS  14  Skin: Skin is warm and dry.  Psychiatric: She has a normal mood and affect. Her behavior is normal. Judgment and thought content normal.    ED Course  Procedures (including critical care time)  Labs Reviewed  CBC - Abnormal; Notable for the following:    RBC 2.82 (*)     Hemoglobin 8.3 (*)     HCT 25.4 (*)     All other components within normal limits  DIFFERENTIAL - Abnormal; Notable for the following:    Monocytes Relative 14 (*)     Monocytes Absolute 1.4 (*)     All other components within normal limits  BASIC METABOLIC PANEL - Abnormal; Notable for the following:    Sodium 134 (*)     Glucose, Bld 101 (*)     BUN 41 (*)     Creatinine, Ser 1.59 (*)     GFR calc non Af Amer 32 (*)     GFR calc Af Amer 37 (*)     All other components within normal limits  PROTIME-INR  URINALYSIS, ROUTINE W REFLEX MICROSCOPIC  URINE RAPID DRUG SCREEN (HOSP PERFORMED)  COMPREHENSIVE METABOLIC PANEL  CBC  VITAMIN B12  FOLATE  IRON AND TIBC  FERRITIN  RETICULOCYTES  TSH  AMMONIA  AMMONIA  CARDIAC PANEL(CRET KIN+CKTOT+MB+TROPI)  GLUCOSE, POCT (MANUAL RESULT ENTRY)  GLUCOSE, POCT (MANUAL RESULT ENTRY)  GLUCOSE, POCT (MANUAL RESULT ENTRY)  GLUCOSE, POCT (MANUAL RESULT ENTRY)  HEMOGLOBIN A1C  LIPID PANEL  URINE RAPID DRUG SCREEN (HOSP PERFORMED)   Dg Chest 1 View  06/24/2011  *RADIOLOGY REPORT*  Clinical Data: Altered mental status.  Shortness of breath.  CHEST - 1 VIEW  Comparison: 12/24/2007  Findings: Borderline heart size with normal pulmonary vascularity. Calcified and tortuous aorta.  No focal airspace consolidation in the lungs.  No blunting of costophrenic angles.  No pneumothorax. Postoperative changes in the cervical spine.  Degenerative changes in the thoracic spine.  IMPRESSION: No evidence of active pulmonary disease.  Original Report Authenticated By: Marlon Pel, M.D.   Ct Head Wo Contrast  06/24/2011  *RADIOLOGY REPORT*  Clinical Data: 71 year old female with  confusion altered mental status.  CT HEAD WITHOUT CONTRAST  Technique:  Contiguous axial images were obtained from the base of the skull through the vertex without contrast.  Comparison: Cervical spine CTs 02/23/2010 earlier.  Findings: Cervical ACDF hardware partially visible on the scout view. Visualized paranasal sinuses and mastoids are clear.  No acute osseous abnormality identified.  Postoperative changes to the left globe.  No acute orbit or scalp soft tissue findings.  Mild Calcified atherosclerosis at the skull base.  Cerebral volume is within normal limits for age.  No midline shift, ventriculomegaly, mass effect, evidence of mass lesion, intracranial hemorrhage or evidence of cortically based acute infarction.  Gray-white matter differentiation is within normal limits throughout the brain.  No suspicious intracranial vascular hyperdensity.  IMPRESSION: Normal noncontrast CT appearance of the brain for age.  Original Report Authenticated By: Harley Hallmark, M.D.     1. Confusion   2. Carotid bruit  Date: 06/25/2011  Rate: 84 bpm  Rhythm: normal sinus rhythm  QRS Axis: normal  Intervals: normal  ST/T Wave abnormalities: normal  Conduction Disutrbances:none  Narrative Interpretation: No evidence of acute ischemia or arrythmia  Old EKG Reviewed: none available     MDM  71 yo F w/hx of CVA in 2004 presents 9 hrs after episode of sudden-onset of AMS and dysarthria, which has gradually improved since that time; due to duration and improvement in symptoms, patient does not meet Code Stroke criteria. No focal neuro si/sx; however, right carotid bruit and patient is disoriented. Head CT negative for intracranial bleed or evidence of obvious infraction; however, because of clinical picture concerning for recurrent CVA, will admit for CVA.         Clemetine Marker, MD 06/25/11 [phone]

## 2011-06-24 NOTE — ED Provider Notes (Addendum)
I saw and evaluated the patient, reviewed the resident's note and I agree with the findings and plan. Pt is 69 y female with hx of stroke in past.  This afternoon, husband noted slurred speech and confusion.  Brought her to ed.  She does not know why she is here.  She has fluent speech but is confused.  Neuro exam nl.  Carotid bruit on right.  Will do labs, ct and arrange admission.   Cheri Guppy, MD 06/24/11 [phone]  Spoke with Dr. Toniann Fail. He will admit.  Spoke with neurologist.  He will consult.   Cheri Guppy, MD 06/24/11 [phone]

## 2011-06-25 ENCOUNTER — Encounter (HOSPITAL_COMMUNITY): Payer: Self-pay | Admitting: Diagnostic Neuroimaging

## 2011-06-25 DIAGNOSIS — R4182 Altered mental status, unspecified: Secondary | ICD-10-CM | POA: Diagnosis present

## 2011-06-25 DIAGNOSIS — G459 Transient cerebral ischemic attack, unspecified: Secondary | ICD-10-CM

## 2011-06-25 DIAGNOSIS — F0390 Unspecified dementia without behavioral disturbance: Secondary | ICD-10-CM | POA: Diagnosis present

## 2011-06-25 DIAGNOSIS — I2510 Atherosclerotic heart disease of native coronary artery without angina pectoris: Secondary | ICD-10-CM

## 2011-06-25 DIAGNOSIS — N390 Urinary tract infection, site not specified: Secondary | ICD-10-CM | POA: Diagnosis present

## 2011-06-25 DIAGNOSIS — M48061 Spinal stenosis, lumbar region without neurogenic claudication: Secondary | ICD-10-CM | POA: Diagnosis present

## 2011-06-25 DIAGNOSIS — D649 Anemia, unspecified: Secondary | ICD-10-CM | POA: Diagnosis present

## 2011-06-25 DIAGNOSIS — I10 Essential (primary) hypertension: Secondary | ICD-10-CM | POA: Diagnosis present

## 2011-06-25 DIAGNOSIS — F03A0 Unspecified dementia, mild, without behavioral disturbance, psychotic disturbance, mood disturbance, and anxiety: Secondary | ICD-10-CM | POA: Diagnosis present

## 2011-06-25 DIAGNOSIS — R0989 Other specified symptoms and signs involving the circulatory and respiratory systems: Secondary | ICD-10-CM

## 2011-06-25 DIAGNOSIS — G9340 Encephalopathy, unspecified: Secondary | ICD-10-CM | POA: Diagnosis present

## 2011-06-25 DIAGNOSIS — M797 Fibromyalgia: Secondary | ICD-10-CM | POA: Diagnosis present

## 2011-06-25 DIAGNOSIS — R112 Nausea with vomiting, unspecified: Secondary | ICD-10-CM

## 2011-06-25 LAB — RETICULOCYTES
Retic Count, Absolute: 35.8 10*3/uL (ref 19.0–186.0)
Retic Ct Pct: 1.2 % (ref 0.4–3.1)

## 2011-06-25 LAB — COMPREHENSIVE METABOLIC PANEL
Alkaline Phosphatase: 96 U/L (ref 39–117)
BUN: 39 mg/dL — ABNORMAL HIGH (ref 6–23)
CO2: 21 mEq/L (ref 19–32)
Chloride: 104 mEq/L (ref 96–112)
Creatinine, Ser: 1.41 mg/dL — ABNORMAL HIGH (ref 0.50–1.10)
GFR calc non Af Amer: 37 mL/min — ABNORMAL LOW (ref >90)
Glucose, Bld: 96 mg/dL (ref 70–99)
Potassium: 5 mEq/L (ref 3.5–5.1)
Total Bilirubin: 0.4 mg/dL (ref 0.3–1.2)

## 2011-06-25 LAB — CARDIAC PANEL(CRET KIN+CKTOT+MB+TROPI)
CK, MB: 2.8 ng/mL (ref 0.3–4.0)
Total CK: 127 U/L (ref 7–177)

## 2011-06-25 LAB — LIPID PANEL
HDL: 34 mg/dL — ABNORMAL LOW (ref >39)
Total CHOL/HDL Ratio: 3.7 RATIO
Triglycerides: 126 mg/dL (ref <150)

## 2011-06-25 LAB — IRON AND TIBC: Iron: <10 ug/dL — ABNORMAL LOW (ref 42–135)

## 2011-06-25 LAB — GLUCOSE, CAPILLARY
Glucose-Capillary: 123 mg/dL — ABNORMAL HIGH (ref 70–99)
Glucose-Capillary: 147 mg/dL — ABNORMAL HIGH (ref 70–99)
Glucose-Capillary: 116 mg/dL — ABNORMAL HIGH (ref 70–99)

## 2011-06-25 LAB — URINALYSIS, ROUTINE W REFLEX MICROSCOPIC
Ketones, ur: NEGATIVE mg/dL
Nitrite: NEGATIVE
Protein, ur: NEGATIVE mg/dL
pH: 5 (ref 5.0–8.0)

## 2011-06-25 LAB — RAPID URINE DRUG SCREEN, HOSP PERFORMED
Cocaine: NOT DETECTED
Opiates: NOT DETECTED
Tetrahydrocannabinol: NOT DETECTED

## 2011-06-25 LAB — HEMOGLOBIN A1C
Hgb A1c MFr Bld: 5.6 % (ref <5.7)
Mean Plasma Glucose: 114 mg/dL (ref <117)

## 2011-06-25 LAB — AMMONIA: Ammonia: 22 umol/L (ref 11–60)

## 2011-06-25 LAB — URINE MICROSCOPIC-ADD ON

## 2011-06-25 LAB — CBC
MCV: 90.9 fL (ref 78.0–100.0)
Platelets: 215 10*3/uL (ref 150–400)
RBC: 2.98 MIL/uL — ABNORMAL LOW (ref 3.87–5.11)
RDW: 15.4 % (ref 11.5–15.5)
WBC: 10.9 10*3/uL — ABNORMAL HIGH (ref 4.0–10.5)

## 2011-06-25 LAB — VITAMIN B12: Vitamin B-12: 524 pg/mL (ref 211–911)

## 2011-06-25 LAB — TSH: TSH: 0.521 u[IU]/mL (ref 0.350–4.500)

## 2011-06-25 LAB — FERRITIN: Ferritin: 138 ng/mL (ref 10–291)

## 2011-06-25 LAB — OCCULT BLOOD X 1 CARD TO LAB, STOOL: Fecal Occult Bld: NEGATIVE

## 2011-06-25 LAB — TYPE AND SCREEN

## 2011-06-25 MED ORDER — GABAPENTIN 300 MG PO CAPS
300.0000 mg | ORAL_CAPSULE | Freq: Two times a day (BID) | ORAL | Status: DC
  Administered 2011-06-25 – 2011-06-26 (×2): 300 mg via ORAL
  Filled 2011-06-25 (×3): qty 1

## 2011-06-25 MED ORDER — PANTOPRAZOLE SODIUM 40 MG PO TBEC
40.0000 mg | DELAYED_RELEASE_TABLET | Freq: Every day | ORAL | Status: DC
  Administered 2011-06-25 – 2011-06-26 (×2): 40 mg via ORAL
  Filled 2011-06-25 (×2): qty 1

## 2011-06-25 MED ORDER — ASPIRIN 81 MG PO CHEW
81.0000 mg | CHEWABLE_TABLET | Freq: Every day | ORAL | Status: DC
  Administered 2011-06-25 – 2011-06-26 (×2): 81 mg via ORAL
  Filled 2011-06-25 (×2): qty 1

## 2011-06-25 MED ORDER — SIMVASTATIN 20 MG PO TABS
20.0000 mg | ORAL_TABLET | Freq: Every day | ORAL | Status: DC
  Administered 2011-06-25: 20 mg via ORAL
  Filled 2011-06-25 (×2): qty 1

## 2011-06-25 MED ORDER — STROKE: EARLY STAGES OF RECOVERY BOOK
Freq: Once | Status: AC
  Administered 2011-06-25: 10:00:00
  Filled 2011-06-25: qty 1

## 2011-06-25 MED ORDER — ALPRAZOLAM 0.25 MG PO TABS: 0.2500 mg | ORAL_TABLET | Freq: Two times a day (BID) | ORAL | Status: DC | PRN

## 2011-06-25 MED ORDER — ACETAMINOPHEN 325 MG PO TABS
650.0000 mg | ORAL_TABLET | Freq: Four times a day (QID) | ORAL | Status: DC | PRN
  Administered 2011-06-25 – 2011-06-26 (×2): 650 mg via ORAL
  Filled 2011-06-25 (×2): qty 2

## 2011-06-25 MED ORDER — TRAMADOL HCL 50 MG PO TABS
50.0000 mg | ORAL_TABLET | Freq: Four times a day (QID) | ORAL | Status: DC | PRN
  Administered 2011-06-25: 50 mg via ORAL
  Filled 2011-06-25: qty 1

## 2011-06-25 MED ORDER — METOPROLOL SUCCINATE ER 50 MG PO TB24
50.0000 mg | ORAL_TABLET | Freq: Every day | ORAL | Status: DC
  Administered 2011-06-26: 50 mg via ORAL
  Filled 2011-06-25: qty 1

## 2011-06-25 MED ORDER — DONEPEZIL HCL 10 MG PO TABS
10.0000 mg | ORAL_TABLET | Freq: Every day | ORAL | Status: DC
  Administered 2011-06-25: 10 mg via ORAL
  Filled 2011-06-25 (×3): qty 1

## 2011-06-25 MED ORDER — CITALOPRAM HYDROBROMIDE 20 MG PO TABS
20.0000 mg | ORAL_TABLET | Freq: Every evening | ORAL | Status: DC
  Administered 2011-06-25: 20 mg via ORAL
  Filled 2011-06-25 (×2): qty 1

## 2011-06-25 MED ORDER — SODIUM CHLORIDE 0.9 % IV SOLN
INTRAVENOUS | Status: DC
  Administered 2011-06-25: 02:00:00 via INTRAVENOUS

## 2011-06-25 MED ORDER — DEXTROSE 5 % IV SOLN
1.0000 g | INTRAVENOUS | Status: DC
  Administered 2011-06-26: 1 g via INTRAVENOUS
  Filled 2011-06-25 (×2): qty 10

## 2011-06-25 MED ORDER — GABAPENTIN 600 MG PO TABS
300.0000 mg | ORAL_TABLET | Freq: Two times a day (BID) | ORAL | Status: DC
  Filled 2011-06-25: qty 0.5

## 2011-06-25 MED ORDER — DEXTROSE 5 % IV SOLN
1.0000 g | Freq: Once | INTRAVENOUS | Status: AC
  Administered 2011-06-25: 1 g via INTRAVENOUS
  Filled 2011-06-25: qty 10

## 2011-06-25 NOTE — Progress Notes (Signed)
  Echocardiogram 2D Echocardiogram has been performed.  Sarah [name] 06/25/2011, 12:23 PM

## 2011-06-25 NOTE — Progress Notes (Signed)
OT Discharge Note  Patient is being discharged from OT services secondary to:    At Baseline   Spoke with husband daughter and son who report pt is near baseline   Reports declining mental capacity over last 6 months  Please see latest Therapy Progress Note for current level of functioning and progress toward goals.  Progress and discharge plan and discussed with patient/caregiver and they    Agree   Lucile Shutters   OTR/L Pager: [phone] Office: [phone] .

## 2011-06-25 NOTE — H&P (Addendum)
Sarah [name] is an 71 y.o. female.   PCP - Dr.Robert Nicholos Johns. History of pain from ER physician, neurologist notes and patient. Chief Complaint: Confusion. HPI: 71 year old female with known history of CAD status post stenting last cardiac catheter June of 2012, hypertension, fibromyalgia and recent diagnosis of memory problems was noted to be having increasing confusion as noted by her husband. This probably started around few days ago and worsened last 24 hours. There is no complaints of any weakness in the upper lower extremities or did not lose consciousness. Since the symptoms persisted her husband brought her to the ER. Presently I am not able to reach her husband through the phone. Patient states she has not taken any new medications. She does say that she takes pain medication and has not taken more than what is prescribed. Denies any chest pain nausea vomiting abdominal pain. In addition patient is also noticed to have anemia. Patient denies any blood in the stools or black stools. Neurologist has already evaluated the patient and among other workup wants to rule out stroke given that patient's physical exam shows right-sided carotid bruit.  Past Medical History  Diagnosis Date  . HTN (hypertension)   . Spinal stenosis of lumbar region   . Fibromyalgia   . Memory loss     Past Surgical History  Procedure Date  . Lumbar spine surgery L3-L5 PLIF  . Cervical spine surgery C4-C7 ACDF    Family History  Problem Relation Age of Onset  . Coronary artery disease Brother    Social History:  reports that she has never smoked. She does not have any smokeless tobacco history on file. She reports that she does not drink alcohol or use illicit drugs.  Allergies: No Known Allergies  No prescriptions prior to admission    Results for orders placed during the hospital encounter of 06/24/11 (from the past 48 hour(s))  CBC     Status: Abnormal   Collection Time   06/24/11  9:47 PM   Component Value Range Comment   WBC 10.0  4.0 - 10.5 K/uL    RBC 2.82 (*) 3.87 - 5.11 MIL/uL    Hemoglobin 8.3 (*) 12.0 - 15.0 g/dL    HCT 16.1 (*) 09.6 - 46.0 %    MCV 90.1  78.0 - 100.0 fL    MCH 29.4  26.0 - 34.0 pg    MCHC 32.7  30.0 - 36.0 g/dL    RDW 04.5  40.9 - 81.1 %    Platelets 197  150 - 400 K/uL   DIFFERENTIAL     Status: Abnormal   Collection Time   06/24/11  9:47 PM      Component Value Range Comment   Neutrophils Relative 73  43 - 77 %    Neutro Abs 7.3  1.7 - 7.7 K/uL    Lymphocytes Relative 13  12 - 46 %    Lymphs Abs 1.3  0.7 - 4.0 K/uL    Monocytes Relative 14 (*) 3 - 12 %    Monocytes Absolute 1.4 (*) 0.1 - 1.0 K/uL    Eosinophils Relative 1  0 - 5 %    Eosinophils Absolute 0.1  0.0 - 0.7 K/uL    Basophils Relative 0  0 - 1 %    Basophils Absolute 0.0  0.0 - 0.1 K/uL   BASIC METABOLIC PANEL     Status: Abnormal   Collection Time   06/24/11  9:47 PM  Component Value Range Comment   Sodium 134 (*) 135 - 145 mEq/L    Potassium 4.9  3.5 - 5.1 mEq/L    Chloride 102  96 - 112 mEq/L    CO2 20  19 - 32 mEq/L    Glucose, Bld 101 (*) 70 - 99 mg/dL    BUN 41 (*) 6 - 23 mg/dL    Creatinine, Ser 2.13 (*) 0.50 - 1.10 mg/dL    Calcium 9.0  8.4 - 08.6 mg/dL    GFR calc non Af Amer 32 (*) >90 mL/min    GFR calc Af Amer 37 (*) >90 mL/min   PROTIME-INR     Status: Normal   Collection Time   06/24/11 10:28 PM      Component Value Range Comment   Prothrombin Time 15.1  11.6 - 15.2 seconds    INR 1.17  0.00 - 1.49    Dg Chest 1 View  06/24/2011  *RADIOLOGY REPORT*  Clinical Data: Altered mental status.  Shortness of breath.  CHEST - 1 VIEW  Comparison: 12/24/2007  Findings: Borderline heart size with normal pulmonary vascularity. Calcified and tortuous aorta.  No focal airspace consolidation in the lungs.  No blunting of costophrenic angles.  No pneumothorax. Postoperative changes in the cervical spine.  Degenerative changes in the thoracic spine.  IMPRESSION: No  evidence of active pulmonary disease.  Original Report Authenticated By: Marlon Pel, M.D.   Ct Head Wo Contrast  06/24/2011  *RADIOLOGY REPORT*  Clinical Data: 71 year old female with confusion altered mental status.  CT HEAD WITHOUT CONTRAST  Technique:  Contiguous axial images were obtained from the base of the skull through the vertex without contrast.  Comparison: Cervical spine CTs 02/23/2010 earlier.  Findings: Cervical ACDF hardware partially visible on the scout view. Visualized paranasal sinuses and mastoids are clear.  No acute osseous abnormality identified.  Postoperative changes to the left globe.  No acute orbit or scalp soft tissue findings.  Mild Calcified atherosclerosis at the skull base.  Cerebral volume is within normal limits for age.  No midline shift, ventriculomegaly, mass effect, evidence of mass lesion, intracranial hemorrhage or evidence of cortically based acute infarction.  Gray-white matter differentiation is within normal limits throughout the brain.  No suspicious intracranial vascular hyperdensity.  IMPRESSION: Normal noncontrast CT appearance of the brain for age.  Original Report Authenticated By: Harley Hallmark, M.D.    Review of Systems  Constitutional: Negative.   HENT: Negative.   Eyes: Negative.   Respiratory: Negative.   Cardiovascular: Negative.   Gastrointestinal: Negative.   Genitourinary: Negative.   Musculoskeletal: Negative.   Skin: Negative.   Neurological:       Confusion.  Endo/Heme/Allergies: Negative.   Psychiatric/Behavioral: Negative.     Blood pressure 104/67, pulse 87, temperature 99.4 F (37.4 C), temperature source Oral, resp. rate 16, height 5\' 3"  (1.6 m), weight 90.719 kg (200 lb), SpO2 97.00%. Physical Exam  Constitutional: She is oriented to person, place, and time. She appears well-developed and well-nourished. No distress.  HENT:  Head: Normocephalic and atraumatic.  Right Ear: External ear normal.  Left Ear:  External ear normal.  Nose: Nose normal.  Mouth/Throat: Oropharynx is clear and moist. No oropharyngeal exudate.  Eyes: Conjunctivae are normal. Pupils are equal, round, and reactive to light. Right eye exhibits no discharge. Left eye exhibits no discharge. No scleral icterus.  Neck: Normal range of motion. Neck supple.  Cardiovascular: Normal rate and regular rhythm.   Respiratory: Effort normal  and breath sounds normal. No respiratory distress. She has no wheezes. She has no rales.  GI: Soft. Bowel sounds are normal. She exhibits no distension. There is no tenderness. There is no rebound.  Musculoskeletal: Normal range of motion. She exhibits no edema and no tenderness.  Neurological: She is alert and oriented to person, place, and time.       Has some difficulty recalling that happened last evening. Follows commands. Moves all extremities.  Skin: Skin is warm and dry. No rash noted. She is not diaphoretic. No erythema.  Psychiatric: Her behavior is normal.     Assessment/Plan #1. Altered mental status - at this time patient is placed on neurochecks and will get MRI/MRA brain, carotid Doppler and 2-D echo. EKG shows sinus rhythm. Check LFTs and ammonia level along with B12 and folate and TSH. Check drug screen. Check urinalysis #2. Renal failure - probably acute possibly from dehydration. Presently gently hydrate and closely follow metabolic panel. Check urinalysis. #3. Normocytic and normochromic anemia - patient states she has noticed no black stools. I'm rechecking CBC now to make sure there is no significant fall in hemoglobin. Stool for occult blood. Check anemia panel. #4. History of CAD status post stenting - last cardiac catheter as per the records is on June 2012. Presently chest pain-free. #5. History of hypertension - need to check home medications. #6. History of fibromyalgia - need to verify home medications.  Patient's home medications are yet to be reconciled. Medications has  to be reviewed and continued as appropriate. Unable to reach husband at this time. CODE STATUS - full code.  Rylin Saez N. 06/25/2011, 1:41 AM Addendum - patient's UA is compatible with UTI. I have started ceftriaxone and sent for urine cultures. At this point I think patient's symptoms may be from UTI. Patient also is mildly febrile. Repeat CBC is pending. I have done stool for occult blood and send it to the labs. Results are pending.  Midge Minium

## 2011-06-25 NOTE — Consult Note (Signed)
Reason for Consult: Confusion, right carotid bruit Referring Physician: Dr. Lemar Lofty is an 71 y.o. female.  HPI: 71 year old female with hypertension, possible stroke in the past, cervical and lumbar spinal stenosis status post surgeries, fibromyalgia, memory problems, here for evaluation of confusion. Patient is alone in the emergency room.  Apparently patient was in normal state of health until several days ago when she started to feel "not normal". Today she felt significant stress and tension building internally. She felt on the verge of "exploding" with emotion. Her husband told her he thought she should come to the emergency room for further evaluation. Patient family not available to give collateral history.  Patient denies any unilateral numbness or weakness, slurred speech, trouble talking, chest pain, headache, dizziness. She feels back to normal. She does report recent diagnosis of memory problem and has been started on a "memory pill". When I said Aricept, she recognized the name and said that she's been started on this medicine several months ago.  Right carotid bruit was noted by emergency room physician. Patient is being admitted to medical service for stroke/TIA workup and further evaluation of altered mental status.  Past Medical History  Diagnosis Date  . HTN (hypertension)   . Spinal stenosis of lumbar region   . Fibromyalgia   . Memory loss     Past Surgical History  Procedure Date  . Lumbar spine surgery L3-L5 PLIF  . Cervical spine surgery C4-C7 ACDF    Family History  Problem Relation Age of Onset  . Coronary artery disease Brother     Social History:  reports that she has never smoked. She does not have any smokeless tobacco history on file. She reports that she does not drink alcohol or use illicit drugs.  Allergies: No Known Allergies  Current facility-administered medications:0.9 %  sodium chloride infusion, , Intravenous, STAT, Cheri Guppy, MD, Last Rate: 50 mL/hr at 06/24/11 2349 No current outpatient prescriptions on file.   Results for orders placed during the hospital encounter of 06/24/11 (from the past 48 hour(s))  CBC     Status: Abnormal   Collection Time   06/24/11  9:47 PM      Component Value Range Comment   WBC 10.0  4.0 - 10.5 K/uL    RBC 2.82 (*) 3.87 - 5.11 MIL/uL    Hemoglobin 8.3 (*) 12.0 - 15.0 g/dL    HCT 16.1 (*) 09.6 - 46.0 %    MCV 90.1  78.0 - 100.0 fL    MCH 29.4  26.0 - 34.0 pg    MCHC 32.7  30.0 - 36.0 g/dL    RDW 04.5  40.9 - 81.1 %    Platelets 197  150 - 400 K/uL   DIFFERENTIAL     Status: Abnormal   Collection Time   06/24/11  9:47 PM      Component Value Range Comment   Neutrophils Relative 73  43 - 77 %    Neutro Abs 7.3  1.7 - 7.7 K/uL    Lymphocytes Relative 13  12 - 46 %    Lymphs Abs 1.3  0.7 - 4.0 K/uL    Monocytes Relative 14 (*) 3 - 12 %    Monocytes Absolute 1.4 (*) 0.1 - 1.0 K/uL    Eosinophils Relative 1  0 - 5 %    Eosinophils Absolute 0.1  0.0 - 0.7 K/uL    Basophils Relative 0  0 - 1 %  Basophils Absolute 0.0  0.0 - 0.1 K/uL   BASIC METABOLIC PANEL     Status: Abnormal   Collection Time   06/24/11  9:47 PM      Component Value Range Comment   Sodium 134 (*) 135 - 145 mEq/L    Potassium 4.9  3.5 - 5.1 mEq/L    Chloride 102  96 - 112 mEq/L    CO2 20  19 - 32 mEq/L    Glucose, Bld 101 (*) 70 - 99 mg/dL    BUN 41 (*) 6 - 23 mg/dL    Creatinine, Ser 1.61 (*) 0.50 - 1.10 mg/dL    Calcium 9.0  8.4 - 09.6 mg/dL    GFR calc non Af Amer 32 (*) >90 mL/min    GFR calc Af Amer 37 (*) >90 mL/min   PROTIME-INR     Status: Normal   Collection Time   06/24/11 10:28 PM      Component Value Range Comment   Prothrombin Time 15.1  11.6 - 15.2 seconds    INR 1.17  0.00 - 1.49      Dg Chest 1 View  06/24/2011  *RADIOLOGY REPORT*  Clinical Data: Altered mental status.  Shortness of breath.  CHEST - 1 VIEW  Comparison: 12/24/2007  Findings: Borderline heart size with  normal pulmonary vascularity. Calcified and tortuous aorta.  No focal airspace consolidation in the lungs.  No blunting of costophrenic angles.  No pneumothorax. Postoperative changes in the cervical spine.  Degenerative changes in the thoracic spine.  IMPRESSION: No evidence of active pulmonary disease.  Original Report Authenticated By: Marlon Pel, M.D.   Ct Head Wo Contrast  06/24/2011  CT HEAD WITHOUT CONTRAST Findings: Cervical ACDF hardware partially visible on the scout view. Visualized paranasal sinuses and mastoids are clear.  No acute osseous abnormality identified.  Postoperative changes to the left globe.  No acute orbit or scalp soft tissue findings.  Mild Calcified atherosclerosis at the skull base.  Cerebral volume is within normal limits for age.  No midline shift, ventriculomegaly, mass effect, evidence of mass lesion, intracranial hemorrhage or evidence of cortically based acute infarction.  Gray-white matter differentiation is within normal limits throughout the brain.  No suspicious intracranial vascular hyperdensity.  IMPRESSION: Normal noncontrast CT appearance of the brain for age.  Original Report Authenticated By: Harley Hallmark, M.D.  I reviewed CT head images and agree. -VRP  Review of Systems  Constitutional: Positive for malaise/fatigue.  HENT: Negative.   Eyes: Negative.   Respiratory: Negative.   Cardiovascular: Negative.   Gastrointestinal: Negative.   Genitourinary: Negative.   Musculoskeletal: Negative.   Skin: Negative.   Endo/Heme/Allergies: Negative.   Psychiatric/Behavioral: Positive for memory loss.       Internal stress.   Blood pressure 93/57, pulse 86, temperature 99.4 F (37.4 C), temperature source Oral, resp. rate 14, height 5\' 3"  (1.6 m), weight 90.719 kg (200 lb), SpO2 100.00%. Physical Exam GENERAL EXAM: Patient is in no distress  CARDIOVASCULAR: Regular rate and rhythm, FAINT SYSTOLIC MURMUR OVER RIGHT PARASTERNAL REGION RADIATING TO  RIGHT CAROTID.  NEUROLOGIC: MENTAL STATUS: awake, alert, language fluent, comprehension intact, naming intact; ORIENTED TO June 2013, [facility].  CRANIAL NERVE: no papilledema on fundoscopic exam, pupils equal and reactive to light ( ), visual fields full to confrontation, extraocular muscles intact, no nystagmus, facial sensation and strength symmetric, uvula midline, shoulder shrug symmetric, tongue midline. MOTOR: normal bulk and tone, full strength in the BUE, BLE SENSORY:  normal and symmetric to light touch, pinprick, temperature, vibration COORDINATION: finger-nose-finger, fine finger movements, heel-shin normal REFLEXES: deep tendon reflexes TRACE and symmetric GAIT/STATION: BED REST.  Assessment/Plan: 71 year old female with hypertension, fibromyalgia, memory loss (possibly on Aricept), with progressive tension, stress, confusion over last 1-3 days. Also with right carotid bruit. Labs notable for sodium 134, BUN/Cr 41/1.59.   DDX: toxic/metabolic, infectious, neurodegenerative, stroke/TIA  RECS: 1. MRI brain (w/wo), MRA head 2. Carotid u/s, TTE 3. B12, TSH, A1c, ammonia, LFTs 4. Urine drug screen, UA, urine culture, CXR, CBC 5. Get more information from family in AM  [facility] Taquila Leys 06/25/2011, 12:30 AM

## 2011-06-25 NOTE — Progress Notes (Signed)
Was contacted by Dr. Elias Else, Pts Primary MD, Left cell number for reference if there are any questions [phone]. Will fax over Pt summary and most recent physical. He did notice that the pts hgb listed was lower than her normal at the office 10-11 on 05/22/11.

## 2011-06-25 NOTE — ED Notes (Signed)
REPORT GIVEN TO FLOOR NURSE , RE- CHECKED BP PRIOR TO TRANSPORT 104/67 . DENIES PAIN OR DISCOMFORT ,  RESPIRATIONS UNLABORED , IV SITE UNREMARKABLE.  TRANSPORTED BY FLOAT NURSE.

## 2011-06-25 NOTE — Evaluation (Signed)
Physical Therapy Evaluation Patient Details Name: Sarah [name] MRN: [id number] DOB: Aug 17, 1940 Today's Date: 06/25/2011 Time: 7829-5621 PT Time Calculation (min): 22 min  PT Assessment / Plan / Recommendation Clinical Impression  Patient presents with AMS likely secondary to encephalopathy from UTI, but work-up is in progress. She presents at baseline for her functional mobility and has no skilled PT needs.      PT Assessment  Patent does not need any further PT services    Follow Up Recommendations  No PT follow up;Supervision - Intermittent    Barriers to Discharge  None      lEquipment Recommendations  None recommended by PT    Recommendations for Other Services  None   Frequency  N/A   Precautions / Restrictions Precautions Precautions: Fall   Pertinent Vitals/Pain VSS/ No pain      Mobility  Bed Mobility Bed Mobility: Supine to Sit;Sitting - Scoot to Edge of Bed Supine to Sit: 7: Independent Sitting - Scoot to Edge of Bed: 7: Independent Transfers Transfers: Sit to Stand;Stand to Sit Sit to Stand: 7: Independent;With upper extremity assist;Without upper extremity assist;From bed Stand to Sit: To bed;7: Independent Ambulation/Gait Ambulation/Gait Assistance: 7: Independent Ambulation Distance (Feet): 250 Feet Assistive device: None Ambulation/Gait Assistance Details: Increased speed of gait ? secondary to flexed posture resulting in chasing center of gravity. Patient reports that she has limitations at home with walking up hills secondary to chronic neck and back issues and does not particpate in many activities out of the home.   Gait Pattern: Within Functional Limits;Trunk flexed Stairs: Yes Stairs Assistance: 6: Modified independent (Device/Increase time) Stair Management Technique: One rail Right;Alternating pattern;Forwards Modified Rankin (Stroke Patients Only) Pre-Morbid Rankin Score: Slight disability Modified Rankin: Slight disability     Visit  Information  Last PT Received On: 06/25/11 Assistance Needed: +1    Subjective Data  Subjective: Daughter reports that patient's mental status has been gradually declining and that she seems at her now normal baseline. She states her behavior yesterday was different to today.  Patient Stated Goal: Home   Prior Functioning  Home Living Lives With: Spouse Available Help at Discharge: Family;Available 24 hours/day Type of Home: House Home Access: Stairs to enter [employer] of Steps: 3. Stairs from den to [employer] - 3/4 Entrance Stairs-Rails: Right;Left;Can reach both Home Layout: One level Bathroom Shower/Tub: Health visitor: Standard Home Adaptive Equipment: Straight cane;Walker - rolling Prior Function Level of Independence: Independent Able to Take Stairs?: Yes Vocation: Retired Musician: No difficulties    Cognition  Overall Cognitive Status: History of cognitive impairments - at baseline Arousal/Alertness: Awake/alert Orientation Level: Appears intact for tasks assessed Behavior During Session: [facility] for tasks performed    Extremity/Trunk Assessment Right Lower Extremity Assessment RLE ROM/Strength/Tone: WFL for tasks assessed RLE Sensation: WFL - Light Touch;WFL - Proprioception RLE Coordination: WFL - gross/fine motor Left Lower Extremity Assessment LLE ROM/Strength/Tone: WFL for tasks assessed LLE Sensation: WFL - Light Touch;WFL - Proprioception LLE Coordination: WFL - gross/fine motor Trunk Assessment Trunk Assessment: Kyphotic   Balance High Level Balance High Level Balance Activites: Side stepping;Backward walking;Direction changes;Turns;Sudden stops;Head turns High Level Balance Comments: No evidence of imbalance  End of Session PT - End of Session Equipment Utilized During Treatment: Gait belt Activity Tolerance: Patient tolerated treatment well Patient left: in bed;with call bell/phone within reach;with bed alarm  set   Edwyna Perfect, PT  Pager [phone]  06/25/2011, 11:51 AM

## 2011-06-25 NOTE — Progress Notes (Signed)
*  PRELIMINARY RESULTS* Vascular Ultrasound Carotid Duplex (Doppler) has been completed.  The right internal carotid artery demonstrates elevated velocities in the upper 40-59% range, and the left internal carotid artery demonstrates elevated velocities in the mid 40-59% range, however there is no evidence of significant plaque morphology to support stenosis. Bilateral antegrade vertebral artery flow.  06/25/2011 12:10 PM  Elpidio Galea, RDMS,RDCS

## 2011-06-25 NOTE — Progress Notes (Addendum)
[facility] PROGRESS NOTE  Sarah [name] NFA:[id number] DOB: Oct 28, 1940 DOA: 06/24/2011 PCP: [name], MD  Assessment/Plan: 1. Toxic metabolic encephalopathy - probably due to UTI and meds flexeril gabapentin, etc . Resolved by June 24 2. UTI - culture pending . Patient not septic. C/w rocephin  3. History of coronary artery disease and now with carotid bruit - patient to undergo workup for possible TIA/stroke as per neurology recommendations from the emergency room. 4. Acute renal insufficiency suspect due to nsaid and acei - stopped meds. Follow bmp 5. History of mild dementia-suspect patient close to baseline-resume Aricept 6. Anemia - fecal occult blood test negative. Stable hemoglobin. No signs of active bleeding. Workup in progress. Patient had a normal hemoglobin in 2010. Will attempt to get records from Dr. Nicholos [name] and await further labs. 7. CAD - holding acei for now . C/w bb and statin   Principal Problem:  *Encephalopathy acute Active Problems:  Altered mental status  CAD (coronary artery disease)  HTN (hypertension)  Anemia  Spinal stenosis of lumbar region  Fibromyalgia  UTI (lower urinary tract infection)  Mild dementia    Code Status: full Family Communication: husband Disposition Plan: home  [name], MD  [facility] Pager [phone]  If 7PM-7AM, please contact night-coverage [email or web address] Password [email or web address] 06/25/2011, 8:45 AM   LOS: 1 day     Consultants:  Neurology  Procedures:  MRI  Carotid ultrasound  Echocardiogram  Antibiotics:  Rocephin 6/23  HPI/Subjective: Patient has no complains  Objective: Filed Vitals:   06/25/11 0130 06/25/11 0330 06/25/11 0530 06/25/11 0800  BP: 90/51 114/54 143/91 139/77  Pulse: 83 76 62 85  Temp: 99.4 F (37.4 C) 99.1 F (37.3 C) 100.8 F (38.2 C) 100.8 F (38.2 C)  TempSrc:  Oral  Oral  Resp: 15 16 18 20   Height: 5\' 2"  (1.575 m)     Weight: 93.078 kg (205 lb  3.2 oz)     SpO2: 96% 97% 99% 95%    Intake/Output Summary (Last 24 hours) at 06/25/11 0845 Last data filed at 06/25/11 0830  Gross per 24 hour  Intake    360 ml  Output      0 ml  Net    360 ml    Exam:   General:  Alert and oriented x3  Cardiovascular: RRR   Respiratory: CTAB  Abdomen: soft, NT  Neuro - CN 2-12 intact, patient oriented to place and time, strength 5/5 all 4  Data Reviewed: Basic Metabolic Panel:  Lab 06/25/11 2841 06/24/11 2147  NA 138 134*  K 5.0 4.9  CL 104 102  CO2 21 20  GLUCOSE 96 101*  BUN 39* 41*  CREATININE 1.41* 1.59*  CALCIUM 9.1 9.0  MG -- --  PHOS -- --   Liver Function Tests:  Lab 06/25/11 0640  AST 24  ALT 18  ALKPHOS 96  BILITOT 0.4  PROT 7.4  ALBUMIN 3.2*   No results found for this basename: LIPASE:5,AMYLASE:5 in the last 168 hours  Lab 06/25/11 0640  AMMONIA 22   CBC:  Lab 06/25/11 0640 06/24/11 2147  WBC 10.9* 10.0  NEUTROABS -- 7.3  HGB 8.7* 8.3*  HCT 27.1* 25.4*  MCV 90.9 90.1  PLT 215 197   Cardiac Enzymes:  Lab 06/25/11 0640  CKTOTAL 127  CKMB 2.8  CKMBINDEX --  TROPONINI <0.30   BNP (last 3 results) No results found for this basename: PROBNP:3 in the last 8760 hours CBG:  Lab 06/25/11 0825  GLUCAP 123*    No results found for this or any previous visit (from the past 240 hour(s)).   Studies: Dg Chest 1 View  06/24/2011  *RADIOLOGY REPORT*  Clinical Data: Altered mental status.  Shortness of breath.  CHEST - 1 VIEW  Comparison: 12/24/2007  Findings: Borderline heart size with normal pulmonary vascularity. Calcified and tortuous aorta.  No focal airspace consolidation in the lungs.  No blunting of costophrenic angles.  No pneumothorax. Postoperative changes in the cervical spine.  Degenerative changes in the thoracic spine.  IMPRESSION: No evidence of active pulmonary disease.  Original Report Authenticated By: Marlon Pel, M.D.   Ct Head Wo Contrast  06/24/2011  *RADIOLOGY REPORT*   Clinical Data: 71 year old female with confusion altered mental status.  CT HEAD WITHOUT CONTRAST  Technique:  Contiguous axial images were obtained from the base of the skull through the vertex without contrast.  Comparison: Cervical spine CTs 02/23/2010 earlier.  Findings: Cervical ACDF hardware partially visible on the scout view. Visualized paranasal sinuses and mastoids are clear.  No acute osseous abnormality identified.  Postoperative changes to the left globe.  No acute orbit or scalp soft tissue findings.  Mild Calcified atherosclerosis at the skull base.  Cerebral volume is within normal limits for age.  No midline shift, ventriculomegaly, mass effect, evidence of mass lesion, intracranial hemorrhage or evidence of cortically based acute infarction.  Gray-white matter differentiation is within normal limits throughout the brain.  No suspicious intracranial vascular hyperdensity.  IMPRESSION: Normal noncontrast CT appearance of the brain for age.  Original Report Authenticated By: Harley Hallmark, M.D.    Scheduled Meds:   .  stroke: mapping our early stages of recovery book   Does not apply Once  . sodium chloride   Intravenous STAT  . aspirin  81 mg Oral Daily  . cefTRIAXone (ROCEPHIN)  IV  1 g Intravenous Q24H  . cefTRIAXone (ROCEPHIN) IVPB 1 gram/50 mL D5W  1 g Intravenous Once  . citalopram  20 mg Oral QPM  . donepezil  10 mg Oral QHS   Continuous Infusions:   . sodium chloride 100 mL/hr at 06/25/11 1610

## 2011-06-25 NOTE — Progress Notes (Signed)
Utilization review complete 

## 2011-06-26 ENCOUNTER — Inpatient Hospital Stay (HOSPITAL_COMMUNITY): Payer: Medicare Other

## 2011-06-26 DIAGNOSIS — R0989 Other specified symptoms and signs involving the circulatory and respiratory systems: Secondary | ICD-10-CM

## 2011-06-26 DIAGNOSIS — I2510 Atherosclerotic heart disease of native coronary artery without angina pectoris: Secondary | ICD-10-CM

## 2011-06-26 DIAGNOSIS — E782 Mixed hyperlipidemia: Secondary | ICD-10-CM

## 2011-06-26 DIAGNOSIS — R4182 Altered mental status, unspecified: Secondary | ICD-10-CM

## 2011-06-26 DIAGNOSIS — R112 Nausea with vomiting, unspecified: Secondary | ICD-10-CM

## 2011-06-26 LAB — BASIC METABOLIC PANEL
CO2: 20 mEq/L (ref 19–32)
Chloride: 107 mEq/L (ref 96–112)
Creatinine, Ser: 0.87 mg/dL (ref 0.50–1.10)

## 2011-06-26 LAB — GLUCOSE, CAPILLARY
Glucose-Capillary: 103 mg/dL — ABNORMAL HIGH (ref 70–99)
Glucose-Capillary: 95 mg/dL (ref 70–99)

## 2011-06-26 LAB — CBC
HCT: 25 % — ABNORMAL LOW (ref 36.0–46.0)
Platelets: 221 10*3/uL (ref 150–400)
RDW: 15.4 % (ref 11.5–15.5)
WBC: 8.8 10*3/uL (ref 4.0–10.5)

## 2011-06-26 MED ORDER — DONEPEZIL HCL 10 MG PO TABS: 10.0000 mg | ORAL_TABLET | Freq: Every day | ORAL | Status: AC

## 2011-06-26 MED ORDER — CITALOPRAM HYDROBROMIDE 20 MG PO TABS: 20.0000 mg | ORAL_TABLET | Freq: Every day | ORAL | Status: DC

## 2011-06-26 MED ORDER — CEFUROXIME AXETIL 250 MG PO TABS: 500.0000 mg | ORAL_TABLET | Freq: Two times a day (BID) | ORAL | Status: AC

## 2011-06-26 MED ORDER — TRAMADOL HCL 50 MG PO TABS: 50.0000 mg | ORAL_TABLET | Freq: Three times a day (TID) | ORAL | Status: DC | PRN

## 2011-06-26 MED ORDER — GABAPENTIN 600 MG PO TABS: 300.0000 mg | ORAL_TABLET | Freq: Two times a day (BID) | ORAL | Status: DC

## 2011-06-26 MED ORDER — ASPIRIN 81 MG PO CHEW: 81.0000 mg | CHEWABLE_TABLET | Freq: Every day | ORAL | Status: AC

## 2011-06-26 MED ORDER — CYCLOBENZAPRINE HCL 10 MG PO TABS: 5.0000 mg | ORAL_TABLET | Freq: Three times a day (TID) | ORAL | Status: DC | PRN

## 2011-06-26 MED ORDER — ACETAMINOPHEN 500 MG PO TABS: 500.0000 mg | ORAL_TABLET | Freq: Four times a day (QID) | ORAL | Status: AC | PRN

## 2011-06-26 NOTE — Progress Notes (Signed)
Reviewed discharge instructions with patient, son, and husband. They stated their understanding.  Patient discharged via wheelchair.  Sarah [name]

## 2011-06-26 NOTE — Progress Notes (Signed)
[facility] PROGRESS [facility]    SUBJECTIVE  Patient feels back to her baseline and has no complaints.  She is fully alert and bale to follow all commands.  Feels she became overwhelmed at home and lately feels more overwhelmed than usual.    OBJECTIVE   Vital signs in last 24 hours: Temp:  [98.7 F (37.1 C)-102.1 F (38.9 C)] 100.1 F (37.8 C) (06/25 0400) Pulse Rate:  [77-96] 95  (06/25 0400) Resp:  [16-18] 18  (06/25 0400) BP: (112-136)/(60-77) 126/73 mmHg (06/25 0400) SpO2:  [96 %-98 %] 96 % (06/25 0400)  Intake/Output from previous day: 06/24 0701 - 06/25 0700 In: 910 [P.O.:360; I.V.:450; IV Piggyback:100] Out: 400 [Urine:400] Intake/Output this shift: Total I/O In: 240 [P.O.:240] Out: -  Nutritional status: Cardiac  Past Medical History  Diagnosis Date  . HTN (hypertension)   . Spinal stenosis of lumbar region   . Fibromyalgia   . Memory loss     Neurologic Exam:  Mental Status: Alert, oriented, thought content appropriate.  Speech fluent without evidence of aphasia. Able to follow 3 step commands without difficulty. Cranial Nerves: II-Visual fields grossly intact. III/IV/VI-Extraocular movements intact.  Pupils reactive bilaterally. V/VII-Smile symmetric VIII-grossly intact IX/X-normal gag XI-bilateral shoulder shrug XII-midline tongue extension Motor: 5/5 bilaterally with normal tone and bulk Sensory: Pinprick and light touch intact throughout, bilaterally Deep Tendon Reflexes: 1+ and symmetric throughout Plantars downgoing bilaterally Cerebellar: Normal finger-to-nose, normal rapid alternating movements and normal heel-to-shin test.     Lab Results: Results for orders placed during the hospital encounter of 06/24/11 (from the past 48 hour(s))  CBC     Status: Abnormal   Collection Time   06/24/11  9:47 PM      Component Value Range Comment   WBC 10.0  4.0 - 10.5 K/uL    RBC 2.82 (*) 3.87 - 5.11 MIL/uL     Hemoglobin 8.3 (*) 12.0 - 15.0 g/dL    HCT 21.3 (*) 08.6 - 46.0 %    MCV 90.1  78.0 - 100.0 fL    MCH 29.4  26.0 - 34.0 pg    MCHC 32.7  30.0 - 36.0 g/dL    RDW 57.8  46.9 - 62.9 %    Platelets 197  150 - 400 K/uL   DIFFERENTIAL     Status: Abnormal   Collection Time   06/24/11  9:47 PM      Component Value Range Comment   Neutrophils Relative 73  43 - 77 %    Neutro Abs 7.3  1.7 - 7.7 K/uL    Lymphocytes Relative 13  12 - 46 %    Lymphs Abs 1.3  0.7 - 4.0 K/uL    Monocytes Relative 14 (*) 3 - 12 %    Monocytes Absolute 1.4 (*) 0.1 - 1.0 K/uL    Eosinophils Relative 1  0 - 5 %    Eosinophils Absolute 0.1  0.0 - 0.7 K/uL    Basophils Relative 0  0 - 1 %    Basophils Absolute 0.0  0.0 - 0.1 K/uL   BASIC METABOLIC PANEL     Status: Abnormal   Collection Time   06/24/11  9:47 PM      Component Value Range Comment   Sodium 134 (*)  135 - 145 mEq/L    Potassium 4.9  3.5 - 5.1 mEq/L    Chloride 102  96 - 112 mEq/L    CO2 20  19 - 32 mEq/L    Glucose, Bld 101 (*) 70 - 99 mg/dL    BUN 41 (*) 6 - 23 mg/dL    Creatinine, Ser 1.61 (*) 0.50 - 1.10 mg/dL    Calcium 9.0  8.4 - 09.6 mg/dL    GFR calc non Af Amer 32 (*) >90 mL/min    GFR calc Af Amer 37 (*) >90 mL/min   PROTIME-INR     Status: Normal   Collection Time   06/24/11 10:28 PM      Component Value Range Comment   Prothrombin Time 15.1  11.6 - 15.2 seconds    INR 1.17  0.00 - 1.49   URINALYSIS, ROUTINE W REFLEX MICROSCOPIC     Status: Abnormal   Collection Time   06/25/11  1:41 AM      Component Value Range Comment   Color, Urine YELLOW  YELLOW    APPearance TURBID (*) CLEAR    Specific Gravity, Urine 1.019  1.005 - 1.030    pH 5.0  5.0 - 8.0    Glucose, UA NEGATIVE  NEGATIVE mg/dL    Hgb urine dipstick MODERATE (*) NEGATIVE    Bilirubin Urine NEGATIVE  NEGATIVE    Ketones, ur NEGATIVE  NEGATIVE mg/dL    Protein, ur NEGATIVE  NEGATIVE mg/dL    Urobilinogen, UA 1.0  0.0 - 1.0 mg/dL    Nitrite NEGATIVE  NEGATIVE     Leukocytes, UA LARGE (*) NEGATIVE   URINE RAPID DRUG SCREEN (HOSP PERFORMED)     Status: Abnormal   Collection Time   06/25/11  1:41 AM      Component Value Range Comment   Opiates NONE DETECTED  NONE DETECTED    Cocaine NONE DETECTED  NONE DETECTED    Benzodiazepines POSITIVE (*) NONE DETECTED    Amphetamines NONE DETECTED  NONE DETECTED    Tetrahydrocannabinol NONE DETECTED  NONE DETECTED    Barbiturates NONE DETECTED  NONE DETECTED   URINE MICROSCOPIC-ADD ON     Status: Abnormal   Collection Time   06/25/11  1:41 AM      Component Value Range Comment   Squamous Epithelial / LPF MANY (*) RARE    WBC, UA 21-50  <3 WBC/hpf    RBC / HPF 7-10  <3 RBC/hpf    Bacteria, UA MANY (*) RARE    Urine-Other LESS THAN 10 mL OF URINE SUBMITTED     LIPID PANEL     Status: Abnormal   Collection Time   06/25/11  5:00 AM      Component Value Range Comment   Cholesterol 125  0 - 200 mg/dL    Triglycerides 045  <409 mg/dL    HDL 34 (*) >81 mg/dL    Total CHOL/HDL Ratio 3.7      VLDL 25  0 - 40 mg/dL    LDL Cholesterol 66  0 - 99 mg/dL   COMPREHENSIVE METABOLIC PANEL     Status: Abnormal   Collection Time   06/25/11  6:40 AM      Component Value Range Comment   Sodium 138  135 - 145 mEq/L    Potassium 5.0  3.5 - 5.1 mEq/L    Chloride 104  96 - 112 mEq/L    CO2 21  19 - 32 mEq/L  Glucose, Bld 96  70 - 99 mg/dL    BUN 39 (*) 6 - 23 mg/dL    Creatinine, Ser 1.19 (*) 0.50 - 1.10 mg/dL    Calcium 9.1  8.4 - 14.7 mg/dL    Total Protein 7.4  6.0 - 8.3 g/dL    Albumin 3.2 (*) 3.5 - 5.2 g/dL    AST 24  0 - 37 U/L    ALT 18  0 - 35 U/L    Alkaline Phosphatase 96  39 - 117 U/L    Total Bilirubin 0.4  0.3 - 1.2 mg/dL    GFR calc non Af Amer 37 (*) >90 mL/min    GFR calc Af Amer 42 (*) >90 mL/min   CBC     Status: Abnormal   Collection Time   06/25/11  6:40 AM      Component Value Range Comment   WBC 10.9 (*) 4.0 - 10.5 K/uL    RBC 2.98 (*) 3.87 - 5.11 MIL/uL    Hemoglobin 8.7 (*) 12.0 - 15.0  g/dL    HCT 82.9 (*) 56.2 - 46.0 %    MCV 90.9  78.0 - 100.0 fL    MCH 29.2  26.0 - 34.0 pg    MCHC 32.1  30.0 - 36.0 g/dL    RDW 13.0  86.5 - 78.4 %    Platelets 215  150 - 400 K/uL   VITAMIN B12     Status: Normal   Collection Time   06/25/11  6:40 AM      Component Value Range Comment   Vitamin B-12 524  211 - 911 pg/mL   FOLATE     Status: Normal   Collection Time   06/25/11  6:40 AM      Component Value Range Comment   Folate >20.0     IRON AND TIBC     Status: Abnormal   Collection Time   06/25/11  6:40 AM      Component Value Range Comment   Iron <10 (*) 42 - 135 ug/dL    TIBC Not calculated due to Iron <10.  250 - 470 ug/dL    Saturation Ratios Not calculated due to Iron <10.  20 - 55 %    UIBC 327  125 - 400 ug/dL   FERRITIN     Status: Normal   Collection Time   06/25/11  6:40 AM      Component Value Range Comment   Ferritin 138  10 - 291 ng/mL   RETICULOCYTES     Status: Abnormal   Collection Time   06/25/11  6:40 AM      Component Value Range Comment   Retic Ct Pct 1.2  0.4 - 3.1 %    RBC. 2.98 (*) 3.87 - 5.11 MIL/uL    Retic Count, Manual 35.8  19.0 - 186.0 K/uL   TSH     Status: Normal   Collection Time   06/25/11  6:40 AM      Component Value Range Comment   TSH 0.521  0.350 - 4.500 uIU/mL   AMMONIA     Status: Normal   Collection Time   06/25/11  6:40 AM      Component Value Range Comment   Ammonia 22  11 - 60 umol/L   CARDIAC PANEL(CRET KIN+CKTOT+MB+TROPI)     Status: Normal   Collection Time   06/25/11  6:40 AM      Component Value Range Comment  Total CK 127  7 - 177 U/L    CK, MB 2.8  0.3 - 4.0 ng/mL    Troponin I <0.30  <0.30 ng/mL    Relative Index 2.2  0.0 - 2.5   HEMOGLOBIN A1C     Status: Normal   Collection Time   06/25/11  6:40 AM      Component Value Range Comment   Hemoglobin A1C 5.6  <5.7 %    Mean Plasma Glucose 114  <117 mg/dL   OCCULT BLOOD X 1 CARD TO LAB, STOOL     Status: Normal   Collection Time   06/25/11  7:48 AM       Component Value Range Comment   Fecal Occult Bld NEGATIVE     GLUCOSE, CAPILLARY     Status: Abnormal   Collection Time   06/25/11  8:25 AM      Component Value Range Comment   Glucose-Capillary 123 (*) 70 - 99 mg/dL   AMMONIA     Status: Normal   Collection Time   06/25/11 10:36 AM      Component Value Range Comment   Ammonia 29  11 - 60 umol/L   TYPE AND SCREEN     Status: Normal   Collection Time   06/25/11 10:38 AM      Component Value Range Comment   ABO/RH(D) O POS      Antibody Screen NEG      Sample Expiration 06/28/2011     ABO/RH     Status: Normal   Collection Time   06/25/11 10:38 AM      Component Value Range Comment   ABO/RH(D) O POS     GLUCOSE, CAPILLARY     Status: Abnormal   Collection Time   06/25/11 11:24 AM      Component Value Range Comment   Glucose-Capillary 147 (*) 70 - 99 mg/dL    Comment 1 Notify RN     CULTURE, BLOOD (ROUTINE X 2)     Status: Normal (Preliminary result)   Collection Time   06/25/11  4:25 PM      Component Value Range Comment   Specimen Description BLOOD ARM LEFT      Special Requests BOTTLES DRAWN AEROBIC AND ANAEROBIC 10CC      Culture  Setup Time [id number]      Culture        Value:        BLOOD CULTURE RECEIVED NO GROWTH TO DATE CULTURE WILL BE HELD FOR 5 DAYS BEFORE ISSUING A FINAL NEGATIVE REPORT   Report Status PENDING     GLUCOSE, CAPILLARY     Status: Abnormal   Collection Time   06/25/11  4:34 PM      Component Value Range Comment   Glucose-Capillary 116 (*) 70 - 99 mg/dL   CULTURE, BLOOD (ROUTINE X 2)     Status: Normal (Preliminary result)   Collection Time   06/25/11  4:45 PM      Component Value Range Comment   Specimen Description BLOOD ARM LEFT      Special Requests BOTTLES DRAWN AEROBIC AND ANAEROBIC 10CC      Culture  Setup Time [id number]      Culture        Value:        BLOOD CULTURE RECEIVED NO GROWTH TO DATE CULTURE WILL BE HELD FOR 5 DAYS BEFORE ISSUING A FINAL NEGATIVE REPORT   Report Status  PENDING  GLUCOSE, CAPILLARY     Status: Abnormal   Collection Time   06/25/11  9:25 PM      Component Value Range Comment   Glucose-Capillary 109 (*) 70 - 99 mg/dL    Comment 1 Notify RN     BASIC METABOLIC PANEL     Status: Abnormal   Collection Time   06/26/11  5:45 AM      Component Value Range Comment   Sodium 139  135 - 145 mEq/L    Potassium 4.1  3.5 - 5.1 mEq/L    Chloride 107  96 - 112 mEq/L    CO2 20  19 - 32 mEq/L    Glucose, Bld 115 (*) 70 - 99 mg/dL    BUN 23  6 - 23 mg/dL    Creatinine, Ser 4.09  0.50 - 1.10 mg/dL DELTA CHECK NOTED   Calcium 8.8  8.4 - 10.5 mg/dL    GFR calc non Af Amer 65 (*) >90 mL/min    GFR calc Af Amer 76 (*) >90 mL/min   GLUCOSE, CAPILLARY     Status: Abnormal   Collection Time   06/26/11  7:42 AM      Component Value Range Comment   Glucose-Capillary 103 (*) 70 - 99 mg/dL    Lipid Panel  Basename 06/25/11 0500  CHOL 125  TRIG 126  HDL 34*  CHOLHDL 3.7  VLDL 25  LDLCALC 66    Studies/Results: Dg Chest 1 View  06/24/2011  *RADIOLOGY REPORT*  Clinical Data: Altered mental status.  Shortness of breath.  CHEST - 1 VIEW  Comparison: 12/24/2007  Findings: Borderline heart size with normal pulmonary vascularity. Calcified and tortuous aorta.  No focal airspace consolidation in the lungs.  No blunting of costophrenic angles.  No pneumothorax. Postoperative changes in the cervical spine.  Degenerative changes in the thoracic spine.  IMPRESSION: No evidence of active pulmonary disease.  Original Report Authenticated By: Marlon Pel, M.D.   Ct Head Wo Contrast  06/24/2011  *RADIOLOGY REPORT*  Clinical Data: 71 year old female with confusion altered mental status.  CT HEAD WITHOUT CONTRAST  Technique:  Contiguous axial images were obtained from the base of the skull through the vertex without contrast.  Comparison: Cervical spine CTs 02/23/2010 earlier.  Findings: Cervical ACDF hardware partially visible on the scout view. Visualized paranasal  sinuses and mastoids are clear.  No acute osseous abnormality identified.  Postoperative changes to the left globe.  No acute orbit or scalp soft tissue findings.  Mild Calcified atherosclerosis at the skull base.  Cerebral volume is within normal limits for age.  No midline shift, ventriculomegaly, mass effect, evidence of mass lesion, intracranial hemorrhage or evidence of cortically based acute infarction.  Gray-white matter differentiation is within normal limits throughout the brain.  No suspicious intracranial vascular hyperdensity.  IMPRESSION: Normal noncontrast CT appearance of the brain for age.  Original Report Authenticated By: Harley Hallmark, M.D.   Mri Brain Without Contrast  06/26/2011  *RADIOLOGY REPORT*  Clinical Data:  Confusion and memory loss  MRI HEAD WITHOUT CONTRAST MRA HEAD WITHOUT CONTRAST  Technique:  Multiplanar, multiecho pulse sequences of the brain and surrounding structures were obtained without intravenous contrast. Angiographic images of the head were obtained using MRA technique without contrast.  Comparison:  CT head 06/24/2011  MRI HEAD  Findings:  Negative for acute infarct.  Scattered small white matter hyperintensities bilaterally, most likely due to chronic microvascular ischemia.  Brainstem and cerebellum are normal. Negative for  mass or edema.  No hemorrhage or fluid collection. Normal brain volume for age.  IMPRESSION: No acute intracranial abnormality.  Mild chronic microvascular ischemic change in the white matter.  MRA HEAD  Findings: Both vertebral arteries are patent to the basilar.  PICA is patent bilaterally.  Superior cerebellar and posterior cerebral arteries are patent bilaterally without significant stenosis.  Internal carotid artery is widely patent bilaterally without stenosis.  Anterior and middle cerebral arteries are patent bilaterally.  There is a focal area of moderate to severe stenosis in the   parietal branch of the right middle cerebral artery.  No  other significant stenosis.  Negative for aneurysm.  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  Original Report Authenticated By: Camelia Phenes, M.D.      Medications:     Prior to Admission:  Prescriptions prior to admission  Medication Sig Dispense Refill  . ALPRAZolam (XANAX) 0.25 MG tablet Take 0.25 mg by mouth 2 (two) times daily as needed. For anxiety.      . cyclobenzaprine (FLEXERIL) 10 MG tablet Take 10 mg by mouth 3 (three) times daily.      . furosemide (LASIX) 20 MG tablet Take 20 mg by mouth every morning.      . gabapentin (NEURONTIN) 600 MG tablet Take 300-1,200 mg by mouth 3 (three) times daily. Take 300 MG in the morning and take 1200 MG in the evening.      Marland Kitchen glycopyrrolate (ROBINUL) 1 MG tablet Take 2 mg by mouth 2 (two) times daily.      Marland Kitchen lisinopril (PRINIVIL,ZESTRIL) 20 MG tablet Take 20 mg by mouth every morning.      . meloxicam (MOBIC) 15 MG tablet Take 15 mg by mouth every morning.      . metoprolol succinate (TOPROL-XL) 50 MG 24 hr tablet Take 50 mg by mouth daily. Take with or immediately following a meal.      . pantoprazole (PROTONIX) 40 MG tablet Take 40 mg by mouth daily.      . pravastatin (PRAVACHOL) 40 MG tablet Take 40 mg by mouth at bedtime.      . traMADol (ULTRAM) 50 MG tablet Take 100 mg by mouth every 8 (eight) hours as needed. For pain.       Scheduled:   .  stroke: mapping our early stages of recovery book   Does not apply Once  . sodium chloride   Intravenous STAT  . aspirin  81 mg Oral Daily  . cefTRIAXone (ROCEPHIN)  IV  1 g Intravenous Q24H  . cefTRIAXone (ROCEPHIN) IVPB 1 gram/50 mL D5W  1 g Intravenous Once  . citalopram  20 mg Oral QPM  . donepezil  10 mg Oral QHS  . gabapentin  300 mg Oral BID  . metoprolol succinate  50 mg Oral Daily  . pantoprazole  40 mg Oral Daily  . simvastatin  20 mg Oral q1800  . DISCONTD: gabapentin  300 mg Oral BID   Carotid Doppler: The  right internal carotid artery demonstrates elevated velocities in the upper 40-59% range, and the left internal carotid artery demonstrates elevated velocities in the mid 40-59% range, however there is no evidence of significant plaque morphology to support stenosis bilaterally. Antegrade vertebral artery flow bilaterally.  2 D echo: Left ventricle: The cavity size was normal. Systolic function was normal. The estimated ejection fraction was in the range of 55% to 60%. Wall motion was normal;  there were no regional wall motion abnormalities. There was an increased relative contribution of atrial contraction to ventricular filling. Features are consistent with a pseudonormal left ventricular filling pattern, with concomitant abnormal relaxation and increased filling pressure (grade 2 diastolic dysfunction).   Assessment/Plan:    Patient Active Hospital Problem List: Encephalopathy acute (06/25/2011)   Assessment: 71 year old female with hypertension, fibromyalgia, chronic memory loss (possibly on Aricept), with progressive tension, stress, confusion over last 1-3 days. Workup has included dementia labs including B-12 (normal), Folate (normal), TSH (normal), Ammonia (normal).  MRI has been done shows no acute stroke and mild White matter disease. Likely acute confusion due to encephalopathy related to UTI and stressors at home in setting of chronic cognitive decline which is currently treated with Aricept.     Plan: At this time would continue treating UTI and have follow up with out patient neurologist for further evaluation of cognition decline.   No further neurologic testing recommended. Neurology will sign off.     Felicie Morn PA-C [facility] [phone]  06/26/2011, 9:50 AM

## 2011-06-26 NOTE — Discharge Summary (Signed)
Physician Discharge Summary  Sarah [name] ZOX:[id number] DOB: June 07, 1940 DOA: 06/24/2011  PCP: [name], MD  Admit date: 06/24/2011 Discharge date: 06/26/2011  Recommendations for Outpatient Follow-up:  1. Followup basic metabolic profile 2. Followup complete blood count 3. Assure resolution of encephalopathy 4. Arrange for outpatient colonoscopy  Discharge Diagnoses:   Encephalopathy acute - suspect due to combination of urinary tract infection and medications-resolved E coli urinary tract infection Acute renal insufficiency-resolved Hx of  CAD (coronary artery disease) Carotid bruit but without proven carotid artery stenosis  HTN (hypertension)  Anemia of unclear etiology  Spinal stenosis of lumbar region  Fibromyalgia  Mild cognitive deficit  Discharge Condition: Good-neurologically intact  Diet recommendation: Heart healthy  History of present illness:  71 year old woman with multiple medical problems was brought to the emergency room by her family for worsening confusion. Initially a code stroke was called but then we realized that the patient was having fevers and workup for infectious causes of confusion was initiated,  Hospital Course:  1. Toxic metabolic encephalopathy - probably due to UTI and meds(eg tramadol flexeril gabapentin) . Resolved by June 24 2. UTI - urine culture showing Escherichia coli pansensitive . Patient not septic. The patient did receive rocephin  for 2 days in the hospital and then we switch her to oral cefuroxime for 5 more days in the outpatient setting. 3. History of coronary artery disease and now with carotid bruit - carotid dopplers indicating possible carotid stenosis by velocity although no plaque could be seen. MRA brain with intracranial vessel stenoses. Neurology consulted and did not recommend any other new treatments.  4. Acute renal insufficiency suspect due to nsaid and acei - stopped meds and placed patient on iv  fluids from 6/23-6/24. Resolved by 6/25. We recommended the patient continue to hold the ACE inhibitor diuretic and nonsteroidal anti-inflammatory drug until she sees her primary care 5. History of mild dementia-suspect patient close to baseline-resumed Aricept. Outpatient followup with Mini-Mental Status exam 6. Anemia - fecal occult blood test negative. Stable hemoglobin. No signs of active bleeding. Labs c/w anemia of chronic/acute illness. FOB negative Patient had a normal hemoglobin in 2010. Records from [name] indicate a Hb of 11.8 on 06/04/11. Recommend close outpatient followup and outpatient colonoscopy. 7. CAD - holding acei for now . C/w bb and statin  8. Chronic back pain due to spinal stenosis - resumed low dose gabapentin and tramadol.     Procedures: Ct Head Wo Contrast  06/24/2011 *RADIOLOGY REPORT* Clinical Data: 71 year old female with confusion altered mental status. CT HEAD WITHOUT CONTRAST Technique: Contiguous axial images were obtained from the base of the skull through the vertex without contrast. Comparison: Cervical spine CTs 02/23/2010 earlier. Findings: Cervical ACDF hardware partially visible on the scout view. Visualized paranasal sinuses and mastoids are clear. No acute osseous abnormality identified. Postoperative changes to the left globe. No acute orbit or scalp soft tissue findings. Mild Calcified atherosclerosis at the skull base. Cerebral volume is within normal limits for age. No midline shift, ventriculomegaly, mass effect, evidence of mass lesion, intracranial hemorrhage or evidence of cortically based acute infarction. Gray-white matter differentiation is within normal limits throughout the brain. No suspicious intracranial vascular hyperdensity. IMPRESSION: Normal noncontrast CT appearance of the brain for age. Original Report Authenticated By: Harley Hallmark, M.D.  Mri Brain Without Contrast  06/26/2011 *RADIOLOGY REPORT* Clinical Data: Confusion and memory loss  MRI HEAD WITHOUT CONTRAST MRA HEAD WITHOUT CONTRAST Technique: Multiplanar, multiecho pulse sequences of the brain and  surrounding structures were obtained without intravenous contrast. Angiographic images of the head were obtained using MRA technique without contrast. Comparison: CT head 06/24/2011 MRI HEAD Findings: Negative for acute infarct. Scattered small white matter hyperintensities bilaterally, most likely due to chronic microvascular ischemia. Brainstem and cerebellum are normal. Negative for mass or edema. No hemorrhage or fluid collection. Normal brain volume for age. IMPRESSION: No acute intracranial abnormality. Mild chronic microvascular ischemic change in the white matter. MRA HEAD Findings: Both vertebral arteries are patent to the basilar. PICA is patent bilaterally. Superior cerebellar and posterior cerebral arteries are patent bilaterally without significant stenosis. Internal carotid artery is widely patent bilaterally without stenosis. Anterior and middle cerebral arteries are patent bilaterally. There is a focal area of moderate to severe stenosis in the parietal branch of the right middle cerebral artery. No other significant stenosis. Negative for aneurysm. 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 Original Report Authenticated By: Camelia Phenes, M.D.     Carotid Doppler:  The right internal carotid artery demonstrates elevated velocities in the upper 40-59% range, and the left internal carotid artery demonstrates elevated velocities in the mid 40-59% range, however there is no evidence of significant plaque morphology to support stenosis bilaterally. Antegrade vertebral artery flow bilaterally.  2 D echo:  Left ventricle: The cavity size was normal. Systolic function was normal. The estimated ejection fraction was in the range of 55% to 60%. Wall motion was normal; there were no regional wall motion  abnormalities. There was an increased relative contribution of atrial contraction to ventricular filling. Features are consistent with a pseudonormal left ventricular filling pattern, with concomitant abnormal relaxation and increased filling pressure (grade 2 diastolic dysfunction).     Consultations:  Neurology  Discharge Exam: Filed Vitals:   06/26/11 1600  BP: 147/76  Pulse: 74  Temp: 98.8 F (37.1 C)  Resp: 20   Filed Vitals:   06/26/11 0400 06/26/11 0959 06/26/11 1200 06/26/11 1600  BP: 126/73 131/77 139/67 147/76  Pulse: 95 71 71 74  Temp: 100.1 F (37.8 C)  98.4 F (36.9 C) 98.8 F (37.1 C)  TempSrc: Oral  Oral Oral  Resp: 18  16 20   Height:      Weight:      SpO2: 96%  95% 97%    Discharge Instructions  Discharge Orders    Future Orders Please Complete By Expires   Diet - low sodium heart healthy      Increase activity slowly        Medication List  As of 06/27/2011  8:50 AM   STOP taking these medications         furosemide 20 MG tablet      lisinopril 20 MG tablet      meloxicam 15 MG tablet         TAKE these medications         acetaminophen 500 MG tablet   Commonly known as: TYLENOL   Take 1 tablet (500 mg total) by mouth every 6 (six) hours as needed for pain or fever.      ALPRAZolam 0.25 MG tablet   Commonly known as: XANAX   Take 0.25 mg by mouth 2 (two) times daily as needed. For anxiety.      aspirin 81 MG chewable tablet   Chew 1 tablet (81 mg total) by mouth daily.      cefUROXime 250 MG tablet   Commonly known as: CEFTIN  Take 2 tablets (500 mg total) by mouth 2 (two) times daily.      citalopram 20 MG tablet   Commonly known as: CELEXA   Take 1 tablet (20 mg total) by mouth daily.      cyclobenzaprine 10 MG tablet   Commonly known as: FLEXERIL   Take 0.5 tablets (5 mg total) by mouth 3 (three) times daily as needed for muscle spasms.      donepezil 10 MG tablet   Commonly known as: ARICEPT   Take 1 tablet (10  mg total) by mouth at bedtime.      gabapentin 600 MG tablet   Commonly known as: NEURONTIN   Take 0.5 tablets (300 mg total) by mouth 2 (two) times daily.      glycopyrrolate 1 MG tablet   Commonly known as: ROBINUL   Take 2 mg by mouth 2 (two) times daily.      metoprolol succinate 50 MG 24 hr tablet   Commonly known as: TOPROL-XL   Take 50 mg by mouth daily. Take with or immediately following a meal.      pantoprazole 40 MG tablet   Commonly known as: PROTONIX   Take 40 mg by mouth daily.      pravastatin 40 MG tablet   Commonly known as: PRAVACHOL   Take 40 mg by mouth at bedtime.      traMADol 50 MG tablet   Commonly known as: ULTRAM   Take 1 tablet (50 mg total) by mouth every 8 (eight) hours as needed for pain. For pain.           Follow-up Information    Schedule an appointment as soon as possible for a visit with [name], MD.   Contact information:   [facility] [address] [place] [address] [phone]           The results of significant diagnostics from this hospitalization (including imaging, microbiology, ancillary and laboratory) are listed below for reference.    Significant Diagnostic Studies: Dg Chest 1 View  06/24/2011  *RADIOLOGY REPORT*  Clinical Data: Altered mental status.  Shortness of breath.  CHEST - 1 VIEW  Comparison: 12/24/2007  Findings: Borderline heart size with normal pulmonary vascularity. Calcified and tortuous aorta.  No focal airspace consolidation in the lungs.  No blunting of costophrenic angles.  No pneumothorax. Postoperative changes in the cervical spine.  Degenerative changes in the thoracic spine.  IMPRESSION: No evidence of active pulmonary disease.  Original Report Authenticated By: Marlon Pel, M.D.   Ct Head Wo Contrast  06/24/2011  *RADIOLOGY REPORT*  Clinical Data: 71 year old female with confusion altered mental status.  CT HEAD WITHOUT  CONTRAST  Technique:  Contiguous axial images were obtained from the base of the skull through the vertex without contrast.  Comparison: Cervical spine CTs 02/23/2010 earlier.  Findings: Cervical ACDF hardware partially visible on the scout view. Visualized paranasal sinuses and mastoids are clear.  No acute osseous abnormality identified.  Postoperative changes to the left globe.  No acute orbit or scalp soft tissue findings.  Mild Calcified atherosclerosis at the skull base.  Cerebral volume is within normal limits for age.  No midline shift, ventriculomegaly, mass effect, evidence of mass lesion, intracranial hemorrhage or evidence of cortically based acute infarction.  Gray-white matter differentiation is within normal limits throughout the brain.  No suspicious intracranial vascular hyperdensity.  IMPRESSION: Normal noncontrast CT appearance of the brain for age.  Original Report Authenticated By: Harley Hallmark, M.D.   Mri Brain Without Contrast  06/26/2011  *RADIOLOGY REPORT*  Clinical Data:  Confusion and memory loss  MRI HEAD WITHOUT CONTRAST MRA HEAD WITHOUT CONTRAST  Technique:  Multiplanar, multiecho pulse sequences of the brain and surrounding structures were obtained without intravenous contrast. Angiographic images of the head were obtained using MRA technique without contrast.  Comparison:  CT head 06/24/2011  MRI HEAD  Findings:  Negative for acute infarct.  Scattered small white matter hyperintensities bilaterally, most likely due to chronic microvascular ischemia.  Brainstem and cerebellum are normal. Negative for mass or edema.  No hemorrhage or fluid collection. Normal brain volume for age.  IMPRESSION: No acute intracranial abnormality.  Mild chronic microvascular ischemic change in the white matter.  MRA HEAD  Findings: Both vertebral arteries are patent to the basilar.  PICA is patent bilaterally.  Superior cerebellar and posterior cerebral arteries are patent bilaterally without  significant stenosis.  Internal carotid artery is widely patent bilaterally without stenosis.  Anterior and middle cerebral arteries are patent bilaterally.  There is a focal area of moderate to severe stenosis in the   parietal branch of the right middle cerebral artery.  No other significant stenosis.  Negative for aneurysm.  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  Original Report Authenticated By: Camelia Phenes, M.D.   Mr Mra Head/brain Wo Cm  06/26/2011  *RADIOLOGY REPORT*  Clinical Data:  Confusion and memory loss  MRI HEAD WITHOUT CONTRAST MRA HEAD WITHOUT CONTRAST  Technique:  Multiplanar, multiecho pulse sequences of the brain and surrounding structures were obtained without intravenous contrast. Angiographic images of the head were obtained using MRA technique without contrast.  Comparison:  CT head 06/24/2011  MRI HEAD  Findings:  Negative for acute infarct.  Scattered small white matter hyperintensities bilaterally, most likely due to chronic microvascular ischemia.  Brainstem and cerebellum are normal. Negative for mass or edema.  No hemorrhage or fluid collection. Normal brain volume for age.  IMPRESSION: No acute intracranial abnormality.  Mild chronic microvascular ischemic change in the white matter.  MRA HEAD  Findings: Both vertebral arteries are patent to the basilar.  PICA is patent bilaterally.  Superior cerebellar and posterior cerebral arteries are patent bilaterally without significant stenosis.  Internal carotid artery is widely patent bilaterally without stenosis.  Anterior and middle cerebral arteries are patent bilaterally.  There is a focal area of moderate to severe stenosis in the   parietal branch of the right middle cerebral artery.  No other significant stenosis.  Negative for aneurysm.  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  Original Report  Authenticated By: Camelia Phenes, M.D.    Microbiology: Recent Results (from the past 240 hour(s))  URINE CULTURE     Status: Normal   Collection Time   06/25/11  1:41 AM      Component Value Range Status Comment   Specimen Description URINE, RANDOM   Final    Special Requests CX ADDED 6/24 0807   Final    Culture  Setup Time [id number]   Final    Colony Count >=100,000 COLONIES/ML   Final    Culture ESCHERICHIA COLI   Final    Report Status 06/27/2011 FINAL   Final    Organism ID, Bacteria ESCHERICHIA COLI   Final   CULTURE, BLOOD (ROUTINE X 2)  Status: Normal (Preliminary result)   Collection Time   06/25/11  4:25 PM      Component Value Range Status Comment   Specimen Description BLOOD ARM LEFT   Final    Special Requests BOTTLES DRAWN AEROBIC AND ANAEROBIC 10CC   Final    Culture  Setup Time [id number]   Final    Culture     Final    Value:        BLOOD CULTURE RECEIVED NO GROWTH TO DATE CULTURE WILL BE HELD FOR 5 DAYS BEFORE ISSUING A FINAL NEGATIVE REPORT   Report Status PENDING   Incomplete   CULTURE, BLOOD (ROUTINE X 2)     Status: Normal (Preliminary result)   Collection Time   06/25/11  4:45 PM      Component Value Range Status Comment   Specimen Description BLOOD ARM LEFT   Final    Special Requests BOTTLES DRAWN AEROBIC AND ANAEROBIC 10CC   Final    Culture  Setup Time [id number]   Final    Culture     Final    Value:        BLOOD CULTURE RECEIVED NO GROWTH TO DATE CULTURE WILL BE HELD FOR 5 DAYS BEFORE ISSUING A FINAL NEGATIVE REPORT   Report Status PENDING   Incomplete     Culture & Susceptibility     ESCHERICHIA COLI        Antibiotic  Sensitivity  Microscan  Status     AMPICILLIN  Sensitive  <=2  Final     Method:  MIC     CEFAZOLIN  Sensitive  <=4  Final     Method:  MIC     CEFTRIAXONE  Sensitive  <=1  Final     Method:  MIC     CIPROFLOXACIN  Sensitive  <=0.25  Final     Method:  MIC     GENTAMICIN  Sensitive  <=1  Final     Method:  MIC      LEVOFLOXACIN  Sensitive  <=0.12  Final     Method:  MIC     NITROFURANTOIN  Sensitive  <=16  Final     Method:  MIC     PIP/TAZO  Sensitive  <=4  Final     Method:  MIC     TOBRAMYCIN  Sensitive  <=1  Final     Method:  MIC     TRIMETH/SULFA  Sensitive  <=20  Final     Method:  MIC      Comments  ESCHERICHIA COLI (MIC)      Labs: Basic Metabolic Panel:  Lab 06/26/11 6433 06/25/11 0640 06/24/11 2147  NA 139 138 134*  K 4.1 5.0 4.9  CL 107 104 102  CO2 20 21 20   GLUCOSE 115* 96 101*  BUN 23 39* 41*  CREATININE 0.87 1.41* 1.59*  CALCIUM 8.8 9.1 9.0  MG -- -- --  PHOS -- -- --   Liver Function Tests:  Lab 06/25/11 0640  AST 24  ALT 18  ALKPHOS 96  BILITOT 0.4  PROT 7.4  ALBUMIN 3.2*   No results found for this basename: LIPASE:5,AMYLASE:5 in the last 168 hours  Lab 06/25/11 1036 06/25/11 0640  AMMONIA 29 22   CBC:  Lab 06/26/11 1000 06/25/11 0640 06/24/11 2147  WBC 8.8 10.9* 10.0  NEUTROABS -- -- 7.3  HGB 8.2* 8.7* 8.3*  HCT 25.0* 27.1* 25.4*  MCV 89.6 90.9 90.1  PLT 221  215 197   Cardiac Enzymes:  Lab 06/25/11 0640  CKTOTAL 127  CKMB 2.8  CKMBINDEX --  TROPONINI <0.30   BNP: BNP (last 3 results) No results found for this basename: PROBNP:3 in the last 8760 hours CBG:  Lab 06/26/11 1641 06/26/11 1131 06/26/11 0742 06/25/11 2125 06/25/11 1634  GLUCAP 95 109* 103* 109* 116*    Time coordinating discharge: 50 minutes  Signed:  Lonia Blood, MD  [facility] 06/27/2011, 8:50 AM

## 2011-06-26 NOTE — Progress Notes (Signed)
[facility] PROGRESS NOTE  Sarah [name] QMV:[id number] DOB: 06-27-1940 DOA: 06/24/2011 PCP: [name], MD  Assessment/Plan: 1. Toxic metabolic encephalopathy - probably due to UTI and meds(eg tramadol flexeril gabapentin) . Resolved by June 24 2. UTI - urine culture showing gram-negative rods . Patient not septic. C/w rocephin  3. History of coronary artery disease and now with carotid bruit - carotid dopplers indicating possible carotid stenosis by velocity although no plaque could be seen. MRA brain with intracranial vessel stenoses. Neurology consulted and did not recommend any other new treatments.  4. Acute renal insufficiency suspect due to nsaid and acei - stopped meds and placed patient on iv fluids from 6/23-6/24. Resolved by 6/25 5. History of mild dementia-suspect patient close to baseline-resumed Aricept. Outpatient followup with Mini-Mental Status exam 6. Anemia - fecal occult blood test negative. Stable hemoglobin. No signs of active bleeding. Labs c/w anemia of chronic/acute illness. FOB negative  Patient had a normal hemoglobin in 2010. Records from [name] indicate a Hb of 11.8 on 06/04/11. Recommend close outpatient followup and outpatient colonoscopy. 7. CAD - holding acei for now . C/w bb and statin  8. Chronic back pain due to spinal stenosis - resumed low dose gabapentin and tramadol.  Principal Problem:  *Encephalopathy acute Active Problems:  Altered mental status  CAD (coronary artery disease)  HTN (hypertension)  Anemia  Spinal stenosis of lumbar region  Fibromyalgia  UTI (lower urinary tract infection)  Mild dementia    Code Status: full Family Communication: husband Disposition Plan: home  Aarush Stukey, MD  [facility] Pager [phone]  If 7PM-7AM, please contact night-coverage [email or web address] Password [email or web address] 06/26/2011, 3:41 PM   LOS: 2 days     Consultants:  Neurology  Procedures:  MRI  Carotid  ultrasound  Echocardiogram  Antibiotics:  Rocephin 6/23  HPI/Subjective: Feels much better  Objective: Filed Vitals:   06/26/11 0000 06/26/11 0400 06/26/11 0959 06/26/11 1200  BP: 119/77 126/73 131/77 139/67  Pulse: 78 95 71 71  Temp: 99.6 F (37.6 C) 100.1 F (37.8 C)  98.4 F (36.9 C)  TempSrc: Oral Oral  Oral  Resp: 18 18  16   Height:      Weight:      SpO2: 97% 96%  95%    Intake/Output Summary (Last 24 hours) at 06/26/11 1541 Last data filed at 06/26/11 1330  Gross per 24 hour  Intake    650 ml  Output    400 ml  Net    250 ml    Exam:   General:  Alert and oriented x3  Cardiovascular: RRR   Respiratory: CTAB  Abdomen: soft, NT  Neuro - CN 2-12 intact, patient oriented to place and time, strength 5/5 all 4  Data Reviewed: Basic Metabolic Panel:  Lab 06/26/11 4010 06/25/11 0640 06/24/11 2147  NA 139 138 134*  K 4.1 5.0 4.9  CL 107 104 102  CO2 20 21 20   GLUCOSE 115* 96 101*  BUN 23 39* 41*  CREATININE 0.87 1.41* 1.59*  CALCIUM 8.8 9.1 9.0  MG -- -- --  PHOS -- -- --   Liver Function Tests:  Lab 06/25/11 0640  AST 24  ALT 18  ALKPHOS 96  BILITOT 0.4  PROT 7.4  ALBUMIN 3.2*   No results found for this basename: LIPASE:5,AMYLASE:5 in the last 168 hours  Lab 06/25/11 1036 06/25/11 0640  AMMONIA 29 22   CBC:  Lab 06/26/11 1000 06/25/11 0640 06/24/11 2147  WBC 8.8 10.9*  10.0  NEUTROABS -- -- 7.3  HGB 8.2* 8.7* 8.3*  HCT 25.0* 27.1* 25.4*  MCV 89.6 90.9 90.1  PLT 221 215 197   Cardiac Enzymes:  Lab 06/25/11 0640  CKTOTAL 127  CKMB 2.8  CKMBINDEX --  TROPONINI <0.30   BNP (last 3 results) No results found for this basename: PROBNP:3 in the last 8760 hours CBG:  Lab 06/26/11 1131 06/26/11 0742 06/25/11 2125 06/25/11 1634 06/25/11 1124  GLUCAP 109* 103* 109* 116* 147*    Recent Results (from the past 240 hour(s))  URINE CULTURE     Status: Normal (Preliminary result)   Collection Time   06/25/11  1:41 AM       Component Value Range Status Comment   Specimen Description URINE, RANDOM   Final    Special Requests CX ADDED 6/24 0807   Final    Culture  Setup Time [id number]   Final    Colony Count >=100,000 COLONIES/ML   Final    Culture GRAM NEGATIVE RODS   Final    Report Status PENDING   Incomplete   CULTURE, BLOOD (ROUTINE X 2)     Status: Normal (Preliminary result)   Collection Time   06/25/11  4:25 PM      Component Value Range Status Comment   Specimen Description BLOOD ARM LEFT   Final    Special Requests BOTTLES DRAWN AEROBIC AND ANAEROBIC 10CC   Final    Culture  Setup Time [id number]   Final    Culture     Final    Value:        BLOOD CULTURE RECEIVED NO GROWTH TO DATE CULTURE WILL BE HELD FOR 5 DAYS BEFORE ISSUING A FINAL NEGATIVE REPORT   Report Status PENDING   Incomplete   CULTURE, BLOOD (ROUTINE X 2)     Status: Normal (Preliminary result)   Collection Time   06/25/11  4:45 PM      Component Value Range Status Comment   Specimen Description BLOOD ARM LEFT   Final    Special Requests BOTTLES DRAWN AEROBIC AND ANAEROBIC 10CC   Final    Culture  Setup Time [id number]   Final    Culture     Final    Value:        BLOOD CULTURE RECEIVED NO GROWTH TO DATE CULTURE WILL BE HELD FOR 5 DAYS BEFORE ISSUING A FINAL NEGATIVE REPORT   Report Status PENDING   Incomplete      Studies: Dg Chest 1 View  06/24/2011  *RADIOLOGY REPORT*  Clinical Data: Altered mental status.  Shortness of breath.  CHEST - 1 VIEW  Comparison: 12/24/2007  Findings: Borderline heart size with normal pulmonary vascularity. Calcified and tortuous aorta.  No focal airspace consolidation in the lungs.  No blunting of costophrenic angles.  No pneumothorax. Postoperative changes in the cervical spine.  Degenerative changes in the thoracic spine.  IMPRESSION: No evidence of active pulmonary disease.  Original Report Authenticated By: Marlon Pel, M.D.   Ct Head Wo Contrast  06/24/2011  *RADIOLOGY REPORT*   Clinical Data: 71 year old female with confusion altered mental status.  CT HEAD WITHOUT CONTRAST  Technique:  Contiguous axial images were obtained from the base of the skull through the vertex without contrast.  Comparison: Cervical spine CTs 02/23/2010 earlier.  Findings: Cervical ACDF hardware partially visible on the scout view. Visualized paranasal sinuses and mastoids are clear.  No acute osseous abnormality identified.  Postoperative changes to the left globe.  No acute orbit or scalp soft tissue findings.  Mild Calcified atherosclerosis at the skull base.  Cerebral volume is within normal limits for age.  No midline shift, ventriculomegaly, mass effect, evidence of mass lesion, intracranial hemorrhage or evidence of cortically based acute infarction.  Gray-white matter differentiation is within normal limits throughout the brain.  No suspicious intracranial vascular hyperdensity.  IMPRESSION: Normal noncontrast CT appearance of the brain for age.  Original Report Authenticated By: Harley Hallmark, M.D.   Mri Brain Without Contrast  06/26/2011  *RADIOLOGY REPORT*  Clinical Data:  Confusion and memory loss  MRI HEAD WITHOUT CONTRAST MRA HEAD WITHOUT CONTRAST  Technique:  Multiplanar, multiecho pulse sequences of the brain and surrounding structures were obtained without intravenous contrast. Angiographic images of the head were obtained using MRA technique without contrast.  Comparison:  CT head 06/24/2011  MRI HEAD  Findings:  Negative for acute infarct.  Scattered small white matter hyperintensities bilaterally, most likely due to chronic microvascular ischemia.  Brainstem and cerebellum are normal. Negative for mass or edema.  No hemorrhage or fluid collection. Normal brain volume for age.  IMPRESSION: No acute intracranial abnormality.  Mild chronic microvascular ischemic change in the white matter.  MRA HEAD  Findings: Both vertebral arteries are patent to the basilar.  PICA is patent bilaterally.   Superior cerebellar and posterior cerebral arteries are patent bilaterally without significant stenosis.  Internal carotid artery is widely patent bilaterally without stenosis.  Anterior and middle cerebral arteries are patent bilaterally.  There is a focal area of moderate to severe stenosis in the   parietal branch of the right middle cerebral artery.  No other significant stenosis.  Negative for aneurysm.  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  Original Report Authenticated By: Camelia Phenes, M.D.   Mr Mra Head/brain Wo Cm  06/26/2011  *RADIOLOGY REPORT*  Clinical Data:  Confusion and memory loss  MRI HEAD WITHOUT CONTRAST MRA HEAD WITHOUT CONTRAST  Technique:  Multiplanar, multiecho pulse sequences of the brain and surrounding structures were obtained without intravenous contrast. Angiographic images of the head were obtained using MRA technique without contrast.  Comparison:  CT head 06/24/2011  MRI HEAD  Findings:  Negative for acute infarct.  Scattered small white matter hyperintensities bilaterally, most likely due to chronic microvascular ischemia.  Brainstem and cerebellum are normal. Negative for mass or edema.  No hemorrhage or fluid collection. Normal brain volume for age.  IMPRESSION: No acute intracranial abnormality.  Mild chronic microvascular ischemic change in the white matter.  MRA HEAD  Findings: Both vertebral arteries are patent to the basilar.  PICA is patent bilaterally.  Superior cerebellar and posterior cerebral arteries are patent bilaterally without significant stenosis.  Internal carotid artery is widely patent bilaterally without stenosis.  Anterior and middle cerebral arteries are patent bilaterally.  There is a focal area of moderate to severe stenosis in the   parietal branch of the right middle cerebral artery.  No other significant stenosis.  Negative for aneurysm.  IMPRESSION: Moderate to severe focal  stenosis in the parietal branch of the right middle cerebral artery.  Original Report Authenticated By: Camelia Phenes, M.D.    Scheduled Meds:    . aspirin  81 mg Oral Daily  . cefTRIAXone (ROCEPHIN)  IV  1 g Intravenous Q24H  . citalopram  20 mg Oral QPM  . donepezil  10 mg Oral QHS  . gabapentin  300  mg Oral BID  . metoprolol succinate  50 mg Oral Daily  . pantoprazole  40 mg Oral Daily  . simvastatin  20 mg Oral q1800  . DISCONTD: gabapentin  300 mg Oral BID   Continuous Infusions:

## 2011-06-27 LAB — URINE CULTURE
Colony Count: >=100000
Culture  Setup Time: 201306240817

## 2011-07-01 LAB — CULTURE, BLOOD (ROUTINE X 2): Culture: NO GROWTH

## 2011-07-23 ENCOUNTER — Encounter: Payer: Self-pay | Admitting: Vascular Surgery

## 2011-07-24 ENCOUNTER — Ambulatory Visit (INDEPENDENT_AMBULATORY_CARE_PROVIDER_SITE_OTHER): Payer: Medicare Other | Admitting: Vascular Surgery

## 2011-07-24 ENCOUNTER — Encounter: Payer: Self-pay | Admitting: Vascular Surgery

## 2011-07-24 VITALS — BP 131/85 | HR 73 | Resp 18 | Ht 62.0 in | Wt 180.0 lb

## 2011-07-24 DIAGNOSIS — I6529 Occlusion and stenosis of unspecified carotid artery: Secondary | ICD-10-CM

## 2011-07-24 NOTE — Progress Notes (Signed)
[facility]   Vascular Consult Note    Patient name: DELLE [name] MRN: [id number] DOB: 1940/10/15 Sex: female   Referred by: Nicholos Johns  Reason for referral: Carotid artery stenosis  HISTORY OF PRESENT ILLNESS: The patient is a 71 year old female who had a recent evaluation at the hospital for altered mental status. He specifically denies any focal neurologic deficits. She had some confusion and no weakness. She had a brief admission to the hospital and had a workup to include MRI and MRA of her brain and also carotid duplex and cardiac catheterization. I have the results of all these studies and reviewed them and discussed them with the patient and her husband present. She denies any prior history of stroke amaurosis fugax or take transient ischemic attack. She has no cardiac history. She does have a history of degenerative disc disease with prior back surgery. She does have some ongoing back discomfort.  Past Medical History  Diagnosis Date  . HTN (hypertension)   . Spinal stenosis of lumbar region   . Fibromyalgia   . Memory loss     Past Surgical History  Procedure Date  . Lumbar spine surgery L3-L5 PLIF  . Cervical spine surgery C4-C7 ACDF    History   Social History  . Marital Status: Married    Spouse Name: N/A    Number of Children: N/A  . Years of Education: N/A   Occupational History  . Not on file.   Social History Main Topics  . Smoking status: Never Smoker   . Smokeless tobacco: Never Used  . Alcohol Use: No  . Drug Use: No  . Sexually Active: Not on file   Other Topics Concern  . Not on file   Social History Narrative   Lives with husband. Has 3 children.    Family History  Problem Relation Age of Onset  . Coronary artery disease Brother   . Hyperlipidemia Brother   . Heart disease Mother   . Heart disease Father   . Aneurysm Father   . Hyperlipidemia Father     No Known Allergies  Prior to  Admission medications   Medication Sig Start Date End Date Taking? Authorizing Provider  acetaminophen (TYLENOL) 500 MG tablet Take 1 tablet (500 mg total) by mouth every 6 (six) hours as needed for pain or fever. 06/26/11 06/25/12 Yes Sorin Luanne Bras, MD  ALPRAZolam Prudy Feeler) 0.25 MG tablet Take 0.25 mg by mouth 2 (two) times daily as needed. For anxiety.   Yes Historical Provider, MD  aspirin 81 MG chewable tablet Chew 1 tablet (81 mg total) by mouth daily. 06/26/11 06/25/12 Yes Sorin Luanne Bras, MD  citalopram (CELEXA) 20 MG tablet Take 1 tablet (20 mg total) by mouth daily. 06/26/11  Yes Sorin Luanne Bras, MD  cyclobenzaprine (FLEXERIL) 10 MG tablet Take 0.5 tablets (5 mg total) by mouth 3 (three) times daily as needed for muscle spasms. 06/26/11  Yes Sorin Luanne Bras, MD  donepezil (ARICEPT) 10 MG tablet Take 1 tablet (10 mg total) by mouth at bedtime. 06/26/11  Yes Sorin Luanne Bras, MD  gabapentin (NEURONTIN) 600 MG tablet Take 0.5 tablets (300 mg total) by mouth 2 (two) times daily. 06/26/11  Yes Sorin Luanne Bras, MD  glycopyrrolate (ROBINUL) 1 MG tablet Take 2 mg by mouth 2 (two) times daily.   Yes Historical Provider, MD  metoprolol succinate (TOPROL-XL) 50 MG 24 hr tablet Take 50 mg by mouth daily.  Take with or immediately following a meal.   Yes Historical Provider, MD  pantoprazole (PROTONIX) 40 MG tablet Take 40 mg by mouth daily.   Yes Historical Provider, MD  traMADol (ULTRAM) 50 MG tablet Take 1 tablet (50 mg total) by mouth every 8 (eight) hours as needed for pain. For pain. 06/26/11  Yes Sorin Luanne Bras, MD  pravastatin (PRAVACHOL) 40 MG tablet Take 40 mg by mouth at bedtime.    Historical Provider, MD     REVIEW OF SYSTEMS: Cardiovascular: No chest pain, chest pressure, palpitations, orthopnea, or dyspnea on exertion. No claudication or rest pain,  No history of DVT or phlebitis. Pulmonary: No productive cough, asthma or wheezing. Neurologic: She does report weakness in her arms and legs easily and also  dizziness Hematologic: No bleeding problems or clotting disorders. Musculoskeletal: No joint pain or joint swelling. Gastrointestinal: No blood in stool or hematemesis Genitourinary: No dysuria or hematuria. Psychiatric:: No history of major depression. Integumentary: No rashes or ulcers. Constitutional: No fever or chills.  PHYSICAL EXAMINATION:  Filed Vitals:   07/24/11 0839  BP: 131/85  Pulse: 73  Resp:     General: The patient appears their stated age. Pulmonary: There is a good air exchange bilaterally without wheezing or rales. Abdomen: Soft and non-tender with normal pitch bowel sounds. Musculoskeletal: There are no major deformities.  There is no significant extremity pain. Neurologic: No focal weakness or paresthesias are detected, Skin: There are no ulcer or rashes noted. Psychiatric: The patient has normal affect. Cardiovascular: There is a regular rate and rhythm without significant murmur appreciated.    Outside Studies/Documentation Historical records were reviewed.  They showed MRA of her brain just seen moderate to severe focal stenosis in the parietal branch of her right middle cerebral artery  Carotid Dopplers revealed 40-59% stenoses in her internal carotid artery bilaterally Echocardiogram was normal Cardiac cath showed no critical ischemia  Medication Changes: None  Assessment:  Moderate bilateral asymptomatic carotid stenosis  Plan: I discussed this at length with the patient and her husband present. She's had no focal deficits I would not recommend any further evaluation of her asymptomatic disease. Also discussed the significance of her intracranial disease by MRA. I explained this frequently over causes a level of stenosis. Without symptoms that would recommend continued yearly carotid duplex evaluation and we will schedule this for her in one year. She knows to notify us if she develops any focal neurologic deficits  Deanna Wiater 7/23/201311:01  AM

## 2012-01-29 ENCOUNTER — Other Ambulatory Visit: Payer: Medicare Other

## 2012-01-29 ENCOUNTER — Ambulatory Visit: Payer: Medicare Other | Admitting: Vascular Surgery

## 2012-01-29 ENCOUNTER — Ambulatory Visit: Payer: Medicare Other | Admitting: Neurosurgery

## 2012-02-19 ENCOUNTER — Ambulatory Visit: Payer: Medicare Other | Admitting: Neurosurgery

## 2012-02-19 ENCOUNTER — Other Ambulatory Visit: Payer: Medicare Other

## 2012-05-28 ENCOUNTER — Other Ambulatory Visit: Payer: Self-pay | Admitting: Family Medicine

## 2012-05-28 ENCOUNTER — Other Ambulatory Visit: Payer: Medicare Other

## 2012-05-28 DIAGNOSIS — R1011 Right upper quadrant pain: Secondary | ICD-10-CM

## 2012-07-19 ENCOUNTER — Emergency Department (HOSPITAL_COMMUNITY)
Admission: EM | Admit: 2012-07-19 | Discharge: 2012-07-19 | Disposition: A | Discharge status: Home / Self Care | Payer: Medicare Other | Attending: Emergency Medicine | Admitting: Emergency Medicine

## 2012-07-19 ENCOUNTER — Emergency Department (HOSPITAL_COMMUNITY): Payer: Medicare Other

## 2012-07-19 ENCOUNTER — Encounter (HOSPITAL_COMMUNITY): Payer: Self-pay | Admitting: Nurse Practitioner

## 2012-07-19 DIAGNOSIS — R51 Headache: Secondary | ICD-10-CM | POA: Insufficient documentation

## 2012-07-19 DIAGNOSIS — N390 Urinary tract infection, site not specified: Secondary | ICD-10-CM | POA: Insufficient documentation

## 2012-07-19 DIAGNOSIS — I10 Essential (primary) hypertension: Secondary | ICD-10-CM | POA: Insufficient documentation

## 2012-07-19 DIAGNOSIS — Z79899 Other long term (current) drug therapy: Secondary | ICD-10-CM | POA: Insufficient documentation

## 2012-07-19 DIAGNOSIS — IMO0001 Reserved for inherently not codable concepts without codable children: Secondary | ICD-10-CM | POA: Insufficient documentation

## 2012-07-19 DIAGNOSIS — Z8739 Personal history of other diseases of the musculoskeletal system and connective tissue: Secondary | ICD-10-CM | POA: Insufficient documentation

## 2012-07-19 LAB — URINALYSIS, ROUTINE W REFLEX MICROSCOPIC
Glucose, UA: NEGATIVE mg/dL
Hgb urine dipstick: NEGATIVE
Protein, ur: NEGATIVE mg/dL

## 2012-07-19 LAB — COMPREHENSIVE METABOLIC PANEL
AST: 16 U/L (ref 0–37)
Albumin: 3.4 g/dL — ABNORMAL LOW (ref 3.5–5.2)
Chloride: 100 mEq/L (ref 96–112)
Creatinine, Ser: 0.86 mg/dL (ref 0.50–1.10)
Potassium: 3.9 mEq/L (ref 3.5–5.1)
Sodium: 137 mEq/L (ref 135–145)
Total Bilirubin: 0.2 mg/dL — ABNORMAL LOW (ref 0.3–1.2)

## 2012-07-19 LAB — CBC WITH DIFFERENTIAL/PLATELET
Basophils Absolute: 0 10*3/uL (ref 0.0–0.1)
Basophils Relative: 0 % (ref 0–1)
MCHC: 30.1 g/dL (ref 30.0–36.0)
Monocytes Absolute: 0.6 10*3/uL (ref 0.1–1.0)
Neutro Abs: 4.1 10*3/uL (ref 1.7–7.7)
Neutrophils Relative %: 61 % (ref 43–77)
Platelets: 274 10*3/uL (ref 150–400)
RDW: 15.9 % — ABNORMAL HIGH (ref 11.5–15.5)

## 2012-07-19 LAB — URINE MICROSCOPIC-ADD ON

## 2012-07-19 MED ORDER — CEPHALEXIN 500 MG PO CAPS: 500.0000 mg | ORAL_CAPSULE | Freq: Four times a day (QID) | ORAL | Status: DC

## 2012-07-19 NOTE — ED Provider Notes (Signed)
History    CSN: [id number] Arrival date & time 07/19/12  4098  First MD Initiated Contact with Patient 07/19/12 [phone]     Chief Complaint  Patient presents with  . Abdominal Pain  . Headache   (Consider location/radiation/quality/duration/timing/severity/associated sxs/prior Treatment) Patient is a 72 y.o. female presenting with abdominal pain and headaches. The history is provided by the patient (pt complains of mild abd pain).  Abdominal Pain This is a recurrent problem. The current episode started more than 1 week ago. The problem occurs constantly. The problem has not changed since onset.Associated symptoms include abdominal pain and headaches. Pertinent negatives include no chest pain. Nothing aggravates the symptoms.  Headache Associated symptoms: abdominal pain   Associated symptoms: no back pain, no congestion, no cough, no diarrhea, no fatigue, no seizures and no sinus pressure    Past Medical History  Diagnosis Date  . HTN (hypertension)   . Spinal stenosis of lumbar region   . Fibromyalgia   . Memory loss    Past Surgical History  Procedure Laterality Date  . Lumbar spine surgery  L3-L5 PLIF  . Cervical spine surgery  C4-C7 ACDF   Family History  Problem Relation Age of Onset  . Coronary artery disease Brother   . Hyperlipidemia Brother   . Heart disease Mother   . Heart disease Father   . Aneurysm Father   . Hyperlipidemia Father    History  Substance Use Topics  . Smoking status: Never Smoker   . Smokeless tobacco: Never Used  . Alcohol Use: No   OB History   Grav Para Term Preterm Abortions TAB SAB Ect Mult Living                 Review of Systems  Constitutional: Negative for appetite change and fatigue.  HENT: Negative for congestion, sinus pressure and ear discharge.   Eyes: Negative for discharge.  Respiratory: Negative for cough.   Cardiovascular: Negative for chest pain.  Gastrointestinal: Positive for abdominal pain. Negative for  diarrhea.  Genitourinary: Negative for frequency and hematuria.  Musculoskeletal: Negative for back pain.  Skin: Negative for rash.  Neurological: Positive for headaches. Negative for seizures.  Psychiatric/Behavioral: Negative for hallucinations.    Allergies  Review of patient's allergies indicates no known allergies.  Home Medications   Current Outpatient Rx  Name  Route  Sig  Dispense  Refill  . donepezil (ARICEPT) 10 MG tablet   Oral   Take 1 tablet (10 mg total) by mouth at bedtime.         Marland Kitchen lisinopril (PRINIVIL,ZESTRIL) 20 MG tablet   Oral   Take 20 mg by mouth every morning.         . meloxicam (MOBIC) 15 MG tablet   Oral   Take 15 mg by mouth daily.         . Methylsulfonylmethane (MSM) 1000 MG TABS   Oral   Take 1 tablet by mouth 3 (three) times daily.         . metoprolol succinate (TOPROL-XL) 50 MG 24 hr tablet   Oral   Take 50 mg by mouth every morning. Take with or immediately following a meal.         . ALPRAZolam (XANAX) 0.25 MG tablet   Oral   Take 0.25 mg by mouth 2 (two) times daily as needed. For anxiety.         . cephALEXin (KEFLEX) 500 MG capsule   Oral   Take 1  capsule (500 mg total) by mouth 4 (four) times daily.   28 capsule   0   . citalopram (CELEXA) 20 MG tablet   Oral   Take 1 tablet (20 mg total) by mouth daily.   30 tablet   0   . cyclobenzaprine (FLEXERIL) 10 MG tablet   Oral   Take 0.5 tablets (5 mg total) by mouth 3 (three) times daily as needed for muscle spasms.   90 tablet   0   . gabapentin (NEURONTIN) 600 MG tablet   Oral   Take 0.5 tablets (300 mg total) by mouth 2 (two) times daily.   90 tablet   0   . glycopyrrolate (ROBINUL) 1 MG tablet   Oral   Take 2 mg by mouth 2 (two) times daily.         . pantoprazole (PROTONIX) 40 MG tablet   Oral   Take 40 mg by mouth daily.         . pravastatin (PRAVACHOL) 40 MG tablet   Oral   Take 40 mg by mouth at bedtime.         . traMADol (ULTRAM)  50 MG tablet   Oral   Take 1 tablet (50 mg total) by mouth every 8 (eight) hours as needed for pain. For pain.   30 tablet   0    BP 146/59  Pulse 73  Temp(Src) 98 F (36.7 C) (Oral)  Resp 18  SpO2 100% Physical Exam  Constitutional: She is oriented to person, place, and time. She appears well-developed.  HENT:  Head: Normocephalic.  Eyes: Conjunctivae and EOM are normal. No scleral icterus.  Neck: Neck supple. No thyromegaly present.  Cardiovascular: Normal rate and regular rhythm.  Exam reveals no gallop and no friction rub.   No murmur heard. Pulmonary/Chest: No stridor. She has no wheezes. She has no rales. She exhibits no tenderness.  Abdominal: She exhibits no distension. There is tenderness. There is no rebound.  Mild lower abd tender  Musculoskeletal: Normal range of motion. She exhibits no edema.  Lymphadenopathy:    She has no cervical adenopathy.  Neurological: She is oriented to person, place, and time. Coordination normal.  Skin: No rash noted. No erythema.  Psychiatric: She has a normal mood and affect. Her behavior is normal.    ED Course  Procedures (including critical care time) Labs Reviewed  CBC WITH DIFFERENTIAL - Abnormal; Notable for the following:    RBC 3.30 (*)    Hemoglobin 8.1 (*)    HCT 26.9 (*)    MCH 24.5 (*)    RDW 15.9 (*)    All other components within normal limits  COMPREHENSIVE METABOLIC PANEL - Abnormal; Notable for the following:    Glucose, Bld 123 (*)    Albumin 3.4 (*)    Total Bilirubin 0.2 (*)    GFR calc non Af Amer 66 (*)    GFR calc Af Amer 76 (*)    All other components within normal limits  URINALYSIS, ROUTINE W REFLEX MICROSCOPIC - Abnormal; Notable for the following:    APPearance CLOUDY (*)    Leukocytes, UA MODERATE (*)    All other components within normal limits  URINE MICROSCOPIC-ADD ON - Abnormal; Notable for the following:    Bacteria, UA MANY (*)    All other components within normal limits  URINE  CULTURE   Dg Abd Acute W/chest  07/19/2012   *RADIOLOGY REPORT*  Clinical Data: Abdominal pain.  ACUTE  ABDOMEN SERIES (ABDOMEN 2 VIEW & CHEST 1 VIEW)  Comparison: 06/24/2011  Findings:  Normal heart size.  No pleural effusion or edema.  Asymmetric elevation of the right hemidiaphragm is again noted. There is no airspace consolidation.  Moderate gas and stool noted within the colon.  There is no dilated loop of small bowel or fluid level.  Postoperative changes within the lumbar spine compatible with prior laminectomy and posterior fusion.  IMPRESSION:  1.  Nonobstructive bowel gas pattern. 2.  No acute cardiopulmonary abnormalities.   Original Report Authenticated By: Signa Kell, M.D.   1. UTI (lower urinary tract infection)     MDM    Benny Lennert, MD 07/19/12 1221

## 2012-07-19 NOTE — ED Notes (Signed)
Per pt's family:  Generalized abominal pain began 6 weeks ago particularly in the mornings.  Pt is not a good historian per family and cannot remember the last bowel movement.  Pt has hx of IBS.  Pt c/o headache.

## 2012-07-21 LAB — URINE CULTURE: Colony Count: >=100000

## 2012-07-22 NOTE — ED Notes (Signed)
Patient treated with Cephalexin-sensitive to same-chart appended per protocol MD. 

## 2012-10-02 ENCOUNTER — Inpatient Hospital Stay (HOSPITAL_COMMUNITY): Payer: Medicare Other

## 2012-10-02 ENCOUNTER — Encounter (HOSPITAL_COMMUNITY): Payer: Self-pay | Admitting: Emergency Medicine

## 2012-10-02 ENCOUNTER — Inpatient Hospital Stay (HOSPITAL_COMMUNITY)
Admission: EM | Admit: 2012-10-02 | Discharge: 2012-10-06 | DRG: 392 | Disposition: A | Discharge status: Home / Self Care | Payer: Medicare Other | Attending: Internal Medicine | Admitting: Internal Medicine

## 2012-10-02 DIAGNOSIS — G9340 Encephalopathy, unspecified: Secondary | ICD-10-CM

## 2012-10-02 DIAGNOSIS — F03A0 Unspecified dementia, mild, without behavioral disturbance, psychotic disturbance, mood disturbance, and anxiety: Secondary | ICD-10-CM

## 2012-10-02 DIAGNOSIS — N390 Urinary tract infection, site not specified: Secondary | ICD-10-CM

## 2012-10-02 DIAGNOSIS — Z6834 Body mass index (BMI) 34.0-34.9, adult: Secondary | ICD-10-CM

## 2012-10-02 DIAGNOSIS — K208 Other esophagitis without bleeding: Principal | ICD-10-CM | POA: Diagnosis present

## 2012-10-02 DIAGNOSIS — K259 Gastric ulcer, unspecified as acute or chronic, without hemorrhage or perforation: Secondary | ICD-10-CM | POA: Diagnosis present

## 2012-10-02 DIAGNOSIS — R7881 Bacteremia: Secondary | ICD-10-CM

## 2012-10-02 DIAGNOSIS — Z79899 Other long term (current) drug therapy: Secondary | ICD-10-CM

## 2012-10-02 DIAGNOSIS — R509 Fever, unspecified: Secondary | ICD-10-CM | POA: Diagnosis present

## 2012-10-02 DIAGNOSIS — D649 Anemia, unspecified: Secondary | ICD-10-CM

## 2012-10-02 DIAGNOSIS — M48061 Spinal stenosis, lumbar region without neurogenic claudication: Secondary | ICD-10-CM

## 2012-10-02 DIAGNOSIS — M549 Dorsalgia, unspecified: Secondary | ICD-10-CM | POA: Diagnosis present

## 2012-10-02 DIAGNOSIS — IMO0001 Reserved for inherently not codable concepts without codable children: Secondary | ICD-10-CM | POA: Diagnosis present

## 2012-10-02 DIAGNOSIS — D62 Acute posthemorrhagic anemia: Secondary | ICD-10-CM | POA: Diagnosis present

## 2012-10-02 DIAGNOSIS — K315 Obstruction of duodenum: Secondary | ICD-10-CM | POA: Diagnosis present

## 2012-10-02 DIAGNOSIS — E440 Moderate protein-calorie malnutrition: Secondary | ICD-10-CM | POA: Diagnosis present

## 2012-10-02 DIAGNOSIS — E871 Hypo-osmolality and hyponatremia: Secondary | ICD-10-CM | POA: Diagnosis present

## 2012-10-02 DIAGNOSIS — K922 Gastrointestinal hemorrhage, unspecified: Secondary | ICD-10-CM

## 2012-10-02 DIAGNOSIS — I10 Essential (primary) hypertension: Secondary | ICD-10-CM | POA: Diagnosis present

## 2012-10-02 DIAGNOSIS — K269 Duodenal ulcer, unspecified as acute or chronic, without hemorrhage or perforation: Secondary | ICD-10-CM | POA: Diagnosis present

## 2012-10-02 DIAGNOSIS — I2510 Atherosclerotic heart disease of native coronary artery without angina pectoris: Secondary | ICD-10-CM

## 2012-10-02 DIAGNOSIS — E876 Hypokalemia: Secondary | ICD-10-CM

## 2012-10-02 DIAGNOSIS — G8929 Other chronic pain: Secondary | ICD-10-CM

## 2012-10-02 DIAGNOSIS — M797 Fibromyalgia: Secondary | ICD-10-CM

## 2012-10-02 DIAGNOSIS — F0390 Unspecified dementia without behavioral disturbance: Secondary | ICD-10-CM

## 2012-10-02 DIAGNOSIS — Z791 Long term (current) use of non-steroidal anti-inflammatories (NSAID): Secondary | ICD-10-CM

## 2012-10-02 DIAGNOSIS — R4182 Altered mental status, unspecified: Secondary | ICD-10-CM

## 2012-10-02 LAB — COMPREHENSIVE METABOLIC PANEL
AST: 33 U/L (ref 0–37)
Alkaline Phosphatase: 79 U/L (ref 39–117)
BUN: 14 mg/dL (ref 6–23)
CO2: 22 mEq/L (ref 19–32)
Chloride: 92 mEq/L — ABNORMAL LOW (ref 96–112)
Creatinine, Ser: 0.93 mg/dL (ref 0.50–1.10)
GFR calc non Af Amer: 60 mL/min — ABNORMAL LOW (ref >90)
Total Bilirubin: 0.3 mg/dL (ref 0.3–1.2)

## 2012-10-02 LAB — URINALYSIS, ROUTINE W REFLEX MICROSCOPIC
Hgb urine dipstick: NEGATIVE
Ketones, ur: NEGATIVE mg/dL
Protein, ur: NEGATIVE mg/dL
Urobilinogen, UA: 1 mg/dL (ref 0.0–1.0)

## 2012-10-02 LAB — CBC WITH DIFFERENTIAL/PLATELET
Eosinophils Relative: 0 % (ref 0–5)
HCT: 20.6 % — ABNORMAL LOW (ref 36.0–46.0)
Hemoglobin: 6.9 g/dL — CL (ref 12.0–15.0)
Lymphs Abs: 2.1 10*3/uL (ref 0.7–4.0)
MCV: 91.6 fL (ref 78.0–100.0)
Monocytes Relative: 10 % (ref 3–12)
Neutro Abs: 7.9 10*3/uL — ABNORMAL HIGH (ref 1.7–7.7)
RBC: 2.25 MIL/uL — ABNORMAL LOW (ref 3.87–5.11)
WBC: 11.1 10*3/uL — ABNORMAL HIGH (ref 4.0–10.5)

## 2012-10-02 LAB — APTT: aPTT: 32 seconds (ref 24–37)

## 2012-10-02 LAB — PROTIME-INR: INR: 1.04 (ref 0.00–1.49)

## 2012-10-02 LAB — PREPARE RBC (CROSSMATCH)

## 2012-10-02 LAB — OCCULT BLOOD, POC DEVICE: Fecal Occult Bld: POSITIVE — AB

## 2012-10-02 MED ORDER — ONDANSETRON HCL 4 MG PO TABS: 4.0000 mg | ORAL_TABLET | Freq: Four times a day (QID) | ORAL | Status: DC | PRN

## 2012-10-02 MED ORDER — CYCLOBENZAPRINE HCL 10 MG PO TABS
10.0000 mg | ORAL_TABLET | Freq: Every day | ORAL | Status: DC
  Administered 2012-10-03 – 2012-10-05 (×4): 10 mg via ORAL
  Filled 2012-10-02 (×5): qty 1

## 2012-10-02 MED ORDER — SODIUM CHLORIDE 0.9 % IV BOLUS (SEPSIS)
1000.0000 mL | Freq: Once | INTRAVENOUS | Status: AC
  Administered 2012-10-02: 1000 mL via INTRAVENOUS

## 2012-10-02 MED ORDER — GABAPENTIN 400 MG PO CAPS
800.0000 mg | ORAL_CAPSULE | Freq: Three times a day (TID) | ORAL | Status: DC
  Administered 2012-10-02 – 2012-10-06 (×11): 800 mg via ORAL
  Filled 2012-10-02 (×13): qty 2

## 2012-10-02 MED ORDER — LORAZEPAM 2 MG/ML IJ SOLN
1.0000 mg | Freq: Once | INTRAMUSCULAR | Status: AC
  Administered 2012-10-02: 2 mg via INTRAVENOUS
  Filled 2012-10-02: qty 1

## 2012-10-02 MED ORDER — DOXYLAMINE SUCCINATE (SLEEP) 25 MG PO TABS: 50.0000 mg | ORAL_TABLET | Freq: Every evening | ORAL | Status: DC | PRN

## 2012-10-02 MED ORDER — DOCUSATE SODIUM 100 MG PO CAPS
100.0000 mg | ORAL_CAPSULE | Freq: Two times a day (BID) | ORAL | Status: DC
  Administered 2012-10-03 – 2012-10-06 (×8): 100 mg via ORAL
  Filled 2012-10-02 (×9): qty 1

## 2012-10-02 MED ORDER — ONDANSETRON HCL 4 MG/2ML IJ SOLN: 4.0000 mg | Freq: Four times a day (QID) | INTRAMUSCULAR | Status: DC | PRN

## 2012-10-02 MED ORDER — DONEPEZIL HCL 10 MG PO TABS
10.0000 mg | ORAL_TABLET | Freq: Every day | ORAL | Status: DC
  Administered 2012-10-02 – 2012-10-05 (×4): 10 mg via ORAL
  Filled 2012-10-02 (×5): qty 1

## 2012-10-02 MED ORDER — METOPROLOL SUCCINATE ER 50 MG PO TB24
50.0000 mg | ORAL_TABLET | Freq: Every morning | ORAL | Status: DC
  Administered 2012-10-03: 50 mg via ORAL
  Filled 2012-10-02 (×2): qty 1

## 2012-10-02 MED ORDER — LISINOPRIL 20 MG PO TABS
20.0000 mg | ORAL_TABLET | Freq: Every morning | ORAL | Status: DC
  Administered 2012-10-03 – 2012-10-06 (×3): 20 mg via ORAL
  Filled 2012-10-02 (×4): qty 1

## 2012-10-02 MED ORDER — ACETAMINOPHEN 325 MG PO TABS
650.0000 mg | ORAL_TABLET | Freq: Four times a day (QID) | ORAL | Status: DC | PRN
  Administered 2012-10-02 – 2012-10-04 (×4): 650 mg via ORAL
  Filled 2012-10-02 (×4): qty 2

## 2012-10-02 MED ORDER — ALPRAZOLAM 0.25 MG PO TABS
0.2500 mg | ORAL_TABLET | Freq: Two times a day (BID) | ORAL | Status: DC | PRN
  Administered 2012-10-05: 0.25 mg via ORAL
  Filled 2012-10-02: qty 1

## 2012-10-02 MED ORDER — SODIUM CHLORIDE 0.9 % IV SOLN: INTRAVENOUS | Status: DC

## 2012-10-02 MED ORDER — SIMVASTATIN 20 MG PO TABS
20.0000 mg | ORAL_TABLET | Freq: Every day | ORAL | Status: DC
  Administered 2012-10-03 – 2012-10-05 (×3): 20 mg via ORAL
  Filled 2012-10-02 (×4): qty 1

## 2012-10-02 MED ORDER — GLYCOPYRROLATE 1 MG PO TABS
2.0000 mg | ORAL_TABLET | Freq: Two times a day (BID) | ORAL | Status: DC
  Administered 2012-10-02 – 2012-10-06 (×8): 2 mg via ORAL
  Filled 2012-10-02 (×11): qty 2

## 2012-10-02 MED ORDER — ONDANSETRON HCL 4 MG/2ML IJ SOLN
4.0000 mg | Freq: Once | INTRAMUSCULAR | Status: AC
  Administered 2012-10-02: 4 mg via INTRAVENOUS
  Filled 2012-10-02: qty 2

## 2012-10-02 MED ORDER — PANTOPRAZOLE SODIUM 40 MG IV SOLR
40.0000 mg | Freq: Two times a day (BID) | INTRAVENOUS | Status: DC
  Administered 2012-10-03 – 2012-10-04 (×4): 40 mg via INTRAVENOUS
  Filled 2012-10-02 (×5): qty 40

## 2012-10-02 MED ORDER — SORBITOL 70 % SOLN
30.0000 mL | Freq: Every day | Status: DC | PRN
  Filled 2012-10-02: qty 30

## 2012-10-02 MED ORDER — MORPHINE SULFATE 4 MG/ML IJ SOLN
4.0000 mg | Freq: Once | INTRAMUSCULAR | Status: AC
  Administered 2012-10-02: 4 mg via INTRAVENOUS
  Filled 2012-10-02: qty 1

## 2012-10-02 MED ORDER — DULOXETINE HCL 60 MG PO CPEP
60.0000 mg | ORAL_CAPSULE | Freq: Every evening | ORAL | Status: DC
  Administered 2012-10-02 – 2012-10-05 (×4): 60 mg via ORAL
  Filled 2012-10-02 (×5): qty 1

## 2012-10-02 MED ORDER — FUROSEMIDE 20 MG PO TABS
20.0000 mg | ORAL_TABLET | Freq: Every day | ORAL | Status: DC
  Administered 2012-10-03: 20 mg via ORAL
  Filled 2012-10-02: qty 1

## 2012-10-02 MED ORDER — PANTOPRAZOLE SODIUM 40 MG IV SOLR: 40.0000 mg | Freq: Once | INTRAVENOUS | Status: DC

## 2012-10-02 MED ORDER — MORPHINE SULFATE 2 MG/ML IJ SOLN
2.0000 mg | INTRAMUSCULAR | Status: DC | PRN
  Administered 2012-10-03: 2 mg via INTRAVENOUS
  Filled 2012-10-02: qty 1

## 2012-10-02 NOTE — ED Notes (Signed)
[phone] Jillyn Hidden, pt's son, in case of emergency

## 2012-10-02 NOTE — ED Notes (Signed)
POCT OCCULT BLOOD resulted Pos.

## 2012-10-02 NOTE — H&P (Addendum)
[facility] History and Physical  Sarah [name]:[id number] DOB: 14-Aug-1940 DOA: 10/02/2012  Referring physician: Emergency Department PCP: [name], MD  Specialists:   Chief Complaint: GI bleeding  HPI: Sarah [name] is a 72 y.o. female  With a hx of chronic back pain treated w/ chronic NSAIDs who initially presented to her PCP today, where she was found to have a hgb of around 6. On further questioning, the patient reports having "dark" colored stools starting five weeks prior to admit. No CP or syncope. Pt dose complain of increased sob and chills. In the ED, the patient was noted to have a hgb of 6.9. She was given one unit of prbc's. GI was consulted through the ED and the hospitalists were consulted for admission.  Review of Systems:  Per above. The remainder of the 10 pt ros reviewed and are neg  Past Medical History  Diagnosis Date  . HTN (hypertension)   . Spinal stenosis of lumbar region   . Fibromyalgia   . Memory loss    Past Surgical History  Procedure Laterality Date  . Lumbar spine surgery  L3-L5 PLIF  . Cervical spine surgery  C4-C7 ACDF   Social History:  reports that she has never smoked. She has never used smokeless tobacco. She reports that she does not drink alcohol or use illicit drugs.  where does patient live--home, ALF, SNF? and with whom if at home?  Can patient participate in ADLs?  No Known Allergies  Family History  Problem Relation Age of Onset  . Coronary artery disease Brother   . Hyperlipidemia Brother   . Heart disease Mother   . Heart disease Father   . Aneurysm Father   . Hyperlipidemia Father     (be sure to complete)  Prior to Admission medications   Medication Sig Start Date End Date Taking? Authorizing Provider  acetaminophen (TYLENOL) 500 MG tablet Take 500 mg by mouth every 6 (six) hours as needed for pain.   Yes Historical Provider, MD  ALPRAZolam (XANAX) 0.25 MG tablet Take 0.25 mg by mouth 2 (two)  times daily as needed. For anxiety.   Yes Historical Provider, MD  Ascorbic Acid (VITAMIN C) 100 MG tablet Take 100 mg by mouth daily.   Yes Historical Provider, MD  aspirin 325 MG tablet Take 325 mg by mouth daily.   Yes Historical Provider, MD  cyclobenzaprine (FLEXERIL) 10 MG tablet Take 10 mg by mouth at bedtime.   Yes Historical Provider, MD  donepezil (ARICEPT) 10 MG tablet Take 1 tablet (10 mg total) by mouth at bedtime. 06/26/11  Yes Sorin Luanne Bras, MD  doxylamine, Sleep, (UNISOM) 25 MG tablet Take 50 mg by mouth at bedtime as needed for sleep.   Yes Historical Provider, MD  DULoxetine (CYMBALTA) 60 MG capsule Take 60 mg by mouth every evening.   Yes Historical Provider, MD  fish oil-omega-3 fatty acids 1000 MG capsule Take 2 g by mouth daily.   Yes Historical Provider, MD  furosemide (LASIX) 20 MG tablet Take 20 mg by mouth daily.   Yes Historical Provider, MD  gabapentin (NEURONTIN) 800 MG tablet Take 800 mg by mouth 3 (three) times daily.   Yes Historical Provider, MD  glycopyrrolate (ROBINUL) 1 MG tablet Take 2 mg by mouth 2 (two) times daily.   Yes Historical Provider, MD  lisinopril (PRINIVIL,ZESTRIL) 20 MG tablet Take 20 mg by mouth every morning.   Yes Historical Provider, MD  meloxicam (MOBIC) 15 MG tablet  Take 15 mg by mouth daily.   Yes Historical Provider, MD  metoprolol succinate (TOPROL-XL) 50 MG 24 hr tablet Take 50 mg by mouth every morning. Take with or immediately following a meal.   Yes Historical Provider, MD  Multiple Vitamin (MULTIVITAMIN WITH MINERALS) TABS tablet Take 1 tablet by mouth daily.   Yes Historical Provider, MD  pantoprazole (PROTONIX) 40 MG tablet Take 40 mg by mouth daily.   Yes Historical Provider, MD  pravastatin (PRAVACHOL) 40 MG tablet Take 40 mg by mouth at bedtime.   Yes Historical Provider, MD  simethicone (MYLICON) 125 MG chewable tablet Chew 125 mg by mouth every 6 (six) hours as needed for flatulence.   Yes Historical Provider, MD  terbinafine  (LAMISIL) 1 % cream Apply 1 application topically 2 (two) times daily.   Yes Historical Provider, MD  terbinafine (LAMISIL) 250 MG tablet Take 250 mg by mouth daily.   Yes Historical Provider, MD  traMADol (ULTRAM) 50 MG tablet Take 1 tablet (50 mg total) by mouth every 8 (eight) hours as needed for pain. For pain. 06/26/11  Yes Sorin Luanne Bras, MD   Physical Exam: Filed Vitals:   10/02/12 1923 10/02/12 2130 10/02/12 2215  BP: 164/68    Pulse: 115    Temp: 98.9 F (37.2 C)  101.4 F (38.6 C)  TempSrc: Oral  Oral  Resp: 18  18  SpO2: 100% 99%      General:  Awake, in nad  Eyes: PERRL B, subconjunctival palor  ENT: membranes moist, dentition fair  Neck: trachea midline, neck supple  Cardiovascular: regular, s1, s2  Respiratory: normal resp effort, no wheezing  Abdomen: soft, nondistended, nontender  Skin: skin palor, normal skin turgor  Musculoskeletal: perfused, no clubbing  Psychiatric: mood/affect normal // no auditory/visual hallucinations  Neurologic: cn2-12 grossly intact, strength/sensation intact  Labs on Admission:  Basic Metabolic Panel:  Recent Labs Lab 10/02/12 2045  NA 129*  K 3.4*  CL 92*  CO2 22  GLUCOSE 121*  BUN 14  CREATININE 0.93  CALCIUM 9.3   Liver Function Tests:  Recent Labs Lab 10/02/12 2045  AST 33  ALT 28  ALKPHOS 79  BILITOT 0.3  PROT 7.0  ALBUMIN 3.7   No results found for this basename: LIPASE, AMYLASE,  in the last 168 hours No results found for this basename: AMMONIA,  in the last 168 hours CBC:  Recent Labs Lab 10/02/12 2045  WBC 11.1*  NEUTROABS 7.9*  HGB 6.9*  HCT 20.6*  MCV 91.6  PLT 343   Cardiac Enzymes: No results found for this basename: CKTOTAL, CKMB, CKMBINDEX, TROPONINI,  in the last 168 hours  BNP (last 3 results) No results found for this basename: PROBNP,  in the last 8760 hours CBG: No results found for this basename: GLUCAP,  in the last 168 hours  Radiological Exams on Admission: No  results found.  Assessment/Plan Principal Problem:   Upper GI bleed Active Problems:   CAD (coronary artery disease)   HTN (hypertension)   Anemia   Fever   Hyponatremia   Hypokalemia   1. Upper GI bleed 1. Suspect secondary to chronic NSAID use 2. Hold NSAIDs 3. Will cont on BID IV protonix 4. NPO after midnight 5. GI has been consulted through the ED 6. Will admit to medical floor - pt is currently hemodynamically stable 2. Hx CAD 1. Stable. No CP 3. HTN 1. BP stable 2. Cont meds for now 4. Anemia 1. 1 unit prbc's  ordered through the ED. 2. Follow serial H/H 5. Hypokalemia 1. Will replace 6. Hyponatremia 1. Monitor for now 2. Cont diuretics 7. Fevers 1. No significant leukocytosis 2. Will pan-culture 3. Will check CXR 4. May consider empiric abx pending results of above 8. DVT prophylaxis 1. SCD's  Code Status: Full  Family Communication: Pt and family in room Disposition Plan: Pending   Time spent:  Ronnetta Currington, Scheryl Marten [facility] Pager [phone]  If 7PM-7AM, please contact night-coverage [email or web address] Password [email or web address] 10/02/2012, 10:26 PM

## 2012-10-02 NOTE — ED Provider Notes (Signed)
Patient here with her family who report she was seen by her primary care doctor today. They were called and told that her hemoglobin had dropped to 6. Patient has chronic back pain for many years. Reviewing her medication list that she is on meloxicam and baby aspirin daily. Her husband reports she complained of black stools twice last week. Her doctor did start her on iron in the past few months for anemia. Patient doesn't appear to have abdominal pain but is complaining a lot about pain in her back.  She is pale with pale conjunctiva. She has diffuse tenderness of her lumbar spine. Her abdomen soft and nontender.  Medical screening examination/treatment/procedure(s) were conducted as a shared visit with non-physician practitioner(s) and myself.  I personally evaluated the patient during the encounter  Devoria Albe, MD, Franz Dell, MD 10/02/12 2112

## 2012-10-02 NOTE — ED Notes (Signed)
Per ems pt has been c/o n/v and back pain

## 2012-10-02 NOTE — ED Provider Notes (Signed)
CSN: [id number]     Arrival date & time 10/02/12  1919 History   First MD Initiated Contact with Patient 10/02/12 1925     Chief Complaint  Patient presents with  . Nausea  . Emesis  . Back Pain   (Consider location/radiation/quality/duration/timing/severity/associated sxs/prior Treatment) HPI Comments: Patient presents due today because she was called by her PCP and told that her hemoglobin was around 6.   She was told to come to the ED.  Patient with a history of dementia.  History primarily obtained through son and husband.  Husband reports that the patient has had nausea for the past 3 days.  He states that she is not vomiting, but has frequent dry heaving.  She has been eating and drinking less.  Denies diarrhea.  Husband reports that he has not noticed any blood in her stool today, but stool appeared to be darker in color last week.  Patient is currently complaining of pain in her back.  Husband reports that she has had back pain for 50 years.  No acute injury or trauma to the back.  She has been taking Tramadol and Tylenol for the back pain without relief.  Patient reports that her back hurts "all over."  No bowel or bladder incontinence.  Patient denies numbness or tingling.  No fever or chills.  No abdominal pain.  Patient is currently on Aspirin 325 mg and also Mobic daily.  The history is provided by the patient.    Past Medical History  Diagnosis Date  . HTN (hypertension)   . Spinal stenosis of lumbar region   . Fibromyalgia   . Memory loss    Past Surgical History  Procedure Laterality Date  . Lumbar spine surgery  L3-L5 PLIF  . Cervical spine surgery  C4-C7 ACDF   Family History  Problem Relation Age of Onset  . Coronary artery disease Brother   . Hyperlipidemia Brother   . Heart disease Mother   . Heart disease Father   . Aneurysm Father   . Hyperlipidemia Father    History  Substance Use Topics  . Smoking status: Never Smoker   . Smokeless tobacco: Never Used   . Alcohol Use: No   OB History   Grav Para Term Preterm Abortions TAB SAB Ect Mult Living                 Review of Systems  Gastrointestinal: Positive for nausea and vomiting.  Musculoskeletal: Positive for back pain.  All other systems reviewed and are negative.    Allergies  Review of patient's allergies indicates no known allergies.  Home Medications   Current Outpatient Rx  Name  Route  Sig  Dispense  Refill  . acetaminophen (TYLENOL) 500 MG tablet   Oral   Take 500 mg by mouth every 6 (six) hours as needed for pain.         Marland Kitchen ALPRAZolam (XANAX) 0.25 MG tablet   Oral   Take 0.25 mg by mouth 2 (two) times daily as needed. For anxiety.         . Ascorbic Acid (VITAMIN C) 100 MG tablet   Oral   Take 100 mg by mouth daily.         Marland Kitchen aspirin 325 MG tablet   Oral   Take 325 mg by mouth daily.         . cyclobenzaprine (FLEXERIL) 10 MG tablet   Oral   Take 10 mg by mouth at bedtime.         Marland Kitchen  donepezil (ARICEPT) 10 MG tablet   Oral   Take 1 tablet (10 mg total) by mouth at bedtime.         Marland Kitchen doxylamine, Sleep, (UNISOM) 25 MG tablet   Oral   Take 50 mg by mouth at bedtime as needed for sleep.         . DULoxetine (CYMBALTA) 60 MG capsule   Oral   Take 60 mg by mouth every evening.         . fish oil-omega-3 fatty acids 1000 MG capsule   Oral   Take 2 g by mouth daily.         . furosemide (LASIX) 20 MG tablet   Oral   Take 20 mg by mouth daily.         Marland Kitchen gabapentin (NEURONTIN) 800 MG tablet   Oral   Take 800 mg by mouth 3 (three) times daily.         Marland Kitchen glycopyrrolate (ROBINUL) 1 MG tablet   Oral   Take 2 mg by mouth 2 (two) times daily.         Marland Kitchen lisinopril (PRINIVIL,ZESTRIL) 20 MG tablet   Oral   Take 20 mg by mouth every morning.         . meloxicam (MOBIC) 15 MG tablet   Oral   Take 15 mg by mouth daily.         . metoprolol succinate (TOPROL-XL) 50 MG 24 hr tablet   Oral   Take 50 mg by mouth every morning.  Take with or immediately following a meal.         . Multiple Vitamin (MULTIVITAMIN WITH MINERALS) TABS tablet   Oral   Take 1 tablet by mouth daily.         . pantoprazole (PROTONIX) 40 MG tablet   Oral   Take 40 mg by mouth daily.         . pravastatin (PRAVACHOL) 40 MG tablet   Oral   Take 40 mg by mouth at bedtime.         . simethicone (MYLICON) 125 MG chewable tablet   Oral   Chew 125 mg by mouth every 6 (six) hours as needed for flatulence.         . terbinafine (LAMISIL) 1 % cream   Topical   Apply 1 application topically 2 (two) times daily.         Marland Kitchen terbinafine (LAMISIL) 250 MG tablet   Oral   Take 250 mg by mouth daily.         . traMADol (ULTRAM) 50 MG tablet   Oral   Take 1 tablet (50 mg total) by mouth every 8 (eight) hours as needed for pain. For pain.   30 tablet   0    BP 164/68  Pulse 115  Temp(Src) 98.9 F (37.2 C) (Oral)  Resp 18  SpO2 100% Physical Exam  Nursing note and vitals reviewed. Constitutional: She appears well-developed and well-nourished.  Patient pale appearing  HENT:  Head: Normocephalic and atraumatic.  Mouth/Throat: Oropharynx is clear and moist.  Neck: Normal range of motion. Neck supple.  Cardiovascular: Normal rate, regular rhythm and normal heart sounds.   Pulmonary/Chest: Effort normal and breath sounds normal. No respiratory distress. She has no wheezes. She has no rales.  Abdominal: Soft. Bowel sounds are normal. She exhibits no distension and no mass. There is no tenderness. There is no rebound and no guarding.  Genitourinary: Rectal exam  shows no external hemorrhoid. Guaiac positive stool.  No gross rectal bleeding visualized Stool appears brown in color  Musculoskeletal: Normal range of motion.  Neurological: She is alert.  Skin: Skin is warm and dry.  Psychiatric: She has a normal mood and affect.    ED Course  Procedures (including critical care time) Labs Review Labs Reviewed  OCCULT BLOOD,  POC DEVICE - Abnormal; Notable for the following:    Fecal Occult Bld POSITIVE (*)    All other components within normal limits  URINALYSIS, ROUTINE W REFLEX MICROSCOPIC  CBC WITH DIFFERENTIAL  COMPREHENSIVE METABOLIC PANEL  OCCULT BLOOD X 1 CARD TO LAB, STOOL  TYPE AND SCREEN  ABO/RH   Imaging Review No results found.  10:00 PM Discussed with Dr. Rhona Leavens with [facility] who has agreed to admit the patient.    10:13 PM Discussed with Dr Madilyn Fireman with [facility].  He reports that they will be available for consult.    MDM  No diagnosis found. Patient presents today after being called by her PCP and being told that her hemoglobin was low.  Hemoglobin 6.9 in the ED.  Review of the chart shows a baseline around 8.  Hemoccult positive, but no gross rectal bleeding visualized.  Husband did report dark colored stools last week.  Patient on chronic NSAID use.  Patient transfused with one unit PRBC's.  Patient discussed with GI who has agreed to see the patient in consult.  Patient also discussed with [facility] who has agreed to admit the patient for additional management.      Pascal Lux [place], PA-C 10/04/12 1027

## 2012-10-02 NOTE — ED Notes (Signed)
Bed: WA20 Expected date:  Expected time:  Means of arrival:  Comments: ems- weakness, vomiting, low hemoglobin

## 2012-10-02 NOTE — ED Notes (Signed)
Informed MD(hospitalist) that blood cultures could not be drawn until blood infusion is completed.

## 2012-10-03 ENCOUNTER — Encounter (HOSPITAL_COMMUNITY): Payer: Self-pay | Admitting: *Deleted

## 2012-10-03 ENCOUNTER — Encounter (HOSPITAL_COMMUNITY)
Admission: EM | Disposition: A | Discharge status: Home / Self Care | Payer: Self-pay | Source: Home / Self Care | Attending: Internal Medicine

## 2012-10-03 DIAGNOSIS — E440 Moderate protein-calorie malnutrition: Secondary | ICD-10-CM | POA: Insufficient documentation

## 2012-10-03 DIAGNOSIS — E876 Hypokalemia: Secondary | ICD-10-CM

## 2012-10-03 DIAGNOSIS — E871 Hypo-osmolality and hyponatremia: Secondary | ICD-10-CM

## 2012-10-03 DIAGNOSIS — R509 Fever, unspecified: Secondary | ICD-10-CM

## 2012-10-03 HISTORY — PX: ESOPHAGOGASTRODUODENOSCOPY: SHX5428

## 2012-10-03 LAB — CBC WITH DIFFERENTIAL/PLATELET
Eosinophils Absolute: 0 10*3/uL (ref 0.0–0.7)
Eosinophils Relative: 0 % (ref 0–5)
HCT: 23.5 % — ABNORMAL LOW (ref 36.0–46.0)
Hemoglobin: 8 g/dL — ABNORMAL LOW (ref 12.0–15.0)
Lymphocytes Relative: 14 % (ref 12–46)
Lymphs Abs: 1.4 10*3/uL (ref 0.7–4.0)
MCV: 88.3 fL (ref 78.0–100.0)
Monocytes Absolute: 0.5 10*3/uL (ref 0.1–1.0)
Monocytes Relative: 5 % (ref 3–12)
Platelets: 295 10*3/uL (ref 150–400)
RBC: 2.66 MIL/uL — ABNORMAL LOW (ref 3.87–5.11)
WBC: 10 10*3/uL (ref 4.0–10.5)

## 2012-10-03 LAB — HEMOGLOBIN AND HEMATOCRIT, BLOOD: Hemoglobin: 7.7 g/dL — ABNORMAL LOW (ref 12.0–15.0)

## 2012-10-03 SURGERY — EGD (ESOPHAGOGASTRODUODENOSCOPY)
Anesthesia: Moderate Sedation

## 2012-10-03 MED ORDER — SODIUM CHLORIDE 0.9 % IV SOLN
INTRAVENOUS | Status: DC
  Administered 2012-10-03: 200 mL via INTRAVENOUS

## 2012-10-03 MED ORDER — SODIUM CHLORIDE 0.9 % IV BOLUS (SEPSIS)
500.0000 mL | Freq: Once | INTRAVENOUS | Status: AC
  Administered 2012-10-03: 500 mL via INTRAVENOUS

## 2012-10-03 MED ORDER — BUTAMBEN-TETRACAINE-BENZOCAINE 2-2-14 % EX AERO
INHALATION_SPRAY | CUTANEOUS | Status: DC | PRN
  Administered 2012-10-03: 2 via TOPICAL

## 2012-10-03 MED ORDER — FENTANYL CITRATE 0.05 MG/ML IJ SOLN
INTRAMUSCULAR | Status: DC | PRN
  Administered 2012-10-03 (×2): 25 ug via INTRAVENOUS

## 2012-10-03 MED ORDER — MIDAZOLAM HCL 10 MG/2ML IJ SOLN
INTRAMUSCULAR | Status: AC
  Filled 2012-10-03: qty 2

## 2012-10-03 MED ORDER — SUCRALFATE 1 GM/10ML PO SUSP
1.0000 g | Freq: Three times a day (TID) | ORAL | Status: DC
  Administered 2012-10-03 – 2012-10-06 (×11): 1 g via ORAL
  Filled 2012-10-03 (×14): qty 10

## 2012-10-03 MED ORDER — MIDAZOLAM HCL 10 MG/2ML IJ SOLN
INTRAMUSCULAR | Status: DC | PRN
  Administered 2012-10-03 (×3): 2 mg via INTRAVENOUS

## 2012-10-03 MED ORDER — FENTANYL CITRATE 0.05 MG/ML IJ SOLN
INTRAMUSCULAR | Status: AC
  Filled 2012-10-03: qty 2

## 2012-10-03 NOTE — Progress Notes (Signed)
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  Please see dictated procedure report for full list of recommendations. I have added sucralfate to the patient's regimen, and have started her on a clear liquid diet.  Florencia Reasons, M.D. [phone]

## 2012-10-03 NOTE — Consult Note (Addendum)
Referring Provider: Dr. Rickey Barbara Primary Care Physician:  [name], MD Primary Gastroenterologist:  Gentry Fitz  Reason for Consultation:  Anemia  HPI: Sarah [name] is a 72 y.o. female who has been feeling very weak for weeks and has had a decreased appetite and then last Saturday had the acute onset of profuse nausea and vomiting. The vomiting became black in appearance, and she continued to have black vomiting several times per day for several days. Her husband told her she had black stools but she is unaware of that (admit note reports dark stools for past 5 weeks). She feels her bowels have gotten slower recently. Denies abdominal pain. Denies hematochezia. She is on chronic NSAIDs for back pain. Denies any history of ulcers. She thinks she had a colonoscopy within the past 5-10 years. Hgb 6.9 on admit. S/P 1 U PRBCs and Hgb now 8.0. Heme positive. Report of SOB and chills prior to admit.     Past Medical History  Diagnosis Date  . HTN (hypertension)   . Spinal stenosis of lumbar region   . Fibromyalgia   . Memory loss     Past Surgical History  Procedure Laterality Date  . Lumbar spine surgery  L3-L5 PLIF  . Cervical spine surgery  C4-C7 ACDF    Prior to Admission medications   Medication Sig Start Date End Date Taking? Authorizing Provider  acetaminophen (TYLENOL) 500 MG tablet Take 500 mg by mouth every 6 (six) hours as needed for pain.   Yes Historical Provider, MD  ALPRAZolam (XANAX) 0.25 MG tablet Take 0.25 mg by mouth 2 (two) times daily as needed. For anxiety.   Yes Historical Provider, MD  Ascorbic Acid (VITAMIN C) 100 MG tablet Take 100 mg by mouth daily.   Yes Historical Provider, MD  aspirin 325 MG tablet Take 325 mg by mouth daily.   Yes Historical Provider, MD  cyclobenzaprine (FLEXERIL) 10 MG tablet Take 10 mg by mouth at bedtime.   Yes Historical Provider, MD  donepezil (ARICEPT) 10 MG tablet Take 1 tablet (10 mg total) by mouth at bedtime.  06/26/11  Yes Sorin Luanne Bras, MD  doxylamine, Sleep, (UNISOM) 25 MG tablet Take 50 mg by mouth at bedtime as needed for sleep.   Yes Historical Provider, MD  DULoxetine (CYMBALTA) 60 MG capsule Take 60 mg by mouth every evening.   Yes Historical Provider, MD  fish oil-omega-3 fatty acids 1000 MG capsule Take 2 g by mouth daily.   Yes Historical Provider, MD  furosemide (LASIX) 20 MG tablet Take 20 mg by mouth daily.   Yes Historical Provider, MD  gabapentin (NEURONTIN) 800 MG tablet Take 800 mg by mouth 3 (three) times daily.   Yes Historical Provider, MD  glycopyrrolate (ROBINUL) 1 MG tablet Take 2 mg by mouth 2 (two) times daily.   Yes Historical Provider, MD  lisinopril (PRINIVIL,ZESTRIL) 20 MG tablet Take 20 mg by mouth every morning.   Yes Historical Provider, MD  meloxicam (MOBIC) 15 MG tablet Take 15 mg by mouth daily.   Yes Historical Provider, MD  metoprolol succinate (TOPROL-XL) 50 MG 24 hr tablet Take 50 mg by mouth every morning. Take with or immediately following a meal.   Yes Historical Provider, MD  Multiple Vitamin (MULTIVITAMIN WITH MINERALS) TABS tablet Take 1 tablet by mouth daily.   Yes Historical Provider, MD  pantoprazole (PROTONIX) 40 MG tablet Take 40 mg by mouth daily.   Yes Historical Provider, MD  pravastatin (PRAVACHOL) 40 MG tablet  Take 40 mg by mouth at bedtime.   Yes Historical Provider, MD  simethicone (MYLICON) 125 MG chewable tablet Chew 125 mg by mouth every 6 (six) hours as needed for flatulence.   Yes Historical Provider, MD  terbinafine (LAMISIL) 1 % cream Apply 1 application topically 2 (two) times daily.   Yes Historical Provider, MD  terbinafine (LAMISIL) 250 MG tablet Take 250 mg by mouth daily.   Yes Historical Provider, MD  traMADol (ULTRAM) 50 MG tablet Take 1 tablet (50 mg total) by mouth every 8 (eight) hours as needed for pain. For pain. 06/26/11  Yes Sorin Luanne Bras, MD    Scheduled Meds: . cyclobenzaprine  10 mg Oral QHS  . docusate sodium  100 mg Oral  BID  . donepezil  10 mg Oral QHS  . DULoxetine  60 mg Oral QPM  . furosemide  20 mg Oral Daily  . gabapentin  800 mg Oral TID  . glycopyrrolate  2 mg Oral BID  . lisinopril  20 mg Oral q morning - 10a  . metoprolol succinate  50 mg Oral q morning - 10a  . pantoprazole (PROTONIX) IV  40 mg Intravenous Once  . pantoprazole (PROTONIX) IV  40 mg Intravenous Q12H  . simvastatin  20 mg Oral q1800   Continuous Infusions:  PRN Meds:.acetaminophen, ALPRAZolam, morphine injection, ondansetron (ZOFRAN) IV, ondansetron, sorbitol  Allergies as of 10/02/2012  . (No Known Allergies)    Family History  Problem Relation Age of Onset  . Coronary artery disease Brother   . Hyperlipidemia Brother   . Heart disease Mother   . Heart disease Father   . Aneurysm Father   . Hyperlipidemia Father     History   Social History  . Marital Status: Married    Spouse Name: N/A    Number of Children: N/A  . Years of Education: N/A   Occupational History  . Not on file.   Social History Main Topics  . Smoking status: Never Smoker   . Smokeless tobacco: Never Used  . Alcohol Use: No  . Drug Use: No  . Sexual Activity: Not on file   Other Topics Concern  . Not on file   Social History Narrative   Lives with husband. Has 3 children.    Review of Systems: All negative except as stated above in HPI.  Physical Exam: Vital signs: Filed Vitals:   10/03/12 1028  BP: 110/65  Pulse: 96  Temp: 100.3  Resp: 18   Last BM Date: 09/29/12 General:   Lethargic, Well-developed, well-nourished, pleasant and cooperative in NAD HEENT: anicteric Neck: supple, nontender Lungs:  Clear throughout to auscultation.   No wheezes, crackles, or rhonchi. No acute distress. Heart:  Regular rate and rhythm; no murmurs, clicks, rubs,  or gallops. Abdomen: RLQ, RUQ, and epigastric tenderness with guarding, soft, nondistended, +BS  Rectal:  Deferred  GI:  Lab Results:  Recent Labs  10/02/12 2045  10/03/12 0335 10/03/12 0705  WBC 11.1*  --  10.0  HGB 6.9* 7.7* 8.0*  HCT 20.6* 23.6* 23.5*  PLT 343  --  295   BMET  Recent Labs  10/02/12 2045  NA 129*  K 3.4*  CL 92*  CO2 22  GLUCOSE 121*  BUN 14  CREATININE 0.93  CALCIUM 9.3   LFT  Recent Labs  10/02/12 2045  PROT 7.0  ALBUMIN 3.7  AST 33  ALT 28  ALKPHOS 79  BILITOT 0.3   PT/INR  Recent Labs  10/02/12 2045  LABPROT 13.4  INR 1.04     Studies/Results: Dg Chest Port 1 View  10/02/2012   CLINICAL DATA:  Fever.  EXAM: PORTABLE CHEST - 1 VIEW  COMPARISON:  07/19/2012.  FINDINGS: The cardiac silhouette, mediastinal and hilar contours are within normal limits and stable. The lungs are clear. No pleural effusion. The bony thorax is intact.  IMPRESSION: No acute cardiopulmonary findings.   Electronically Signed   By: Loralie Champagne M.D.   On: 10/02/2012 22:54    Impression/Plan: 72 yo with several weeks of melena and coffee grounds emesis with symptomatic anemia. History concerning for an upper GI bleed and needs an EGD today. If EGD unrevealing then will need an inpt colonoscopy. NPO. Protonix IV 40mg  Q 12 hours. Supportive care.    LOS: 1 day   Melyssa Signor C.  10/03/2012, 11:25 AM

## 2012-10-03 NOTE — Progress Notes (Addendum)
[facility] PROGRESS NOTE  Sarah [name] ZOX:[id number] DOB: Oct 12, 1940 DOA: 10/02/2012 PCP: [name], MD  Assessment/Plan: Upper GI bleed  -EGD this pm shows ulcers- 3 significant ulcers (1 prepyloric, 2 in the distal duodenum) With erosive esophagitis and duodenal stenosis, path/studies sent -pt placed on PPI BID and carafate. No NSAIDs -appreciate GI assitance>>to follow Active Problems:  CAD (coronary artery disease)  -pt chest pain free HTN (hypertension)  -BP stable,  Cont current meds  Anemia/ABLA  -secondary to #1 -hgb stable s/p transfusion of 1u PRBC on admit -follow and further transfuse appropriate Fever  Hyponatremia  -likely due to volume depletion -follow recheck with hydration Hypokalemia -k replaced, follow and recheck Fever -unclear etiology -UA and CXR neg -follow cultures and further treat accordingly -hold abx for now as she is afebrile and hemodynamically stable at this time  Code Status: full Family Communication: husband at bedside Disposition Plan: to home when medically ready   Consultants:  GI  Procedures:  For EGD today  Antibiotics:  none  HPI/Subjective: Pt denies N/V today and no melena, tech in room to take down for EGD.  Objective: Filed Vitals:   10/03/12 1650  BP: 122/57  Pulse:   Temp:   Resp: 17    Intake/Output Summary (Last 24 hours) at 10/03/12 1703 Last data filed at 10/03/12 1653  Gross per 24 hour  Intake   1200 ml  Output      0 ml  Net   1200 ml   Filed Weights   10/02/12 2320  Weight: 84.9 kg (187 lb 2.7 oz)    Exam:  General: alert & oriented x 3 In NAD Cardiovascular: RRR, nl S1 s2 Respiratory: CTAB Abdomen: soft +BS NT/ND, no masses palpable Extremities: No cyanosis and no edema      Data Reviewed: Basic Metabolic Panel:  Recent Labs Lab 10/02/12 2045  NA 129*  K 3.4*  CL 92*  CO2 22  GLUCOSE 121*  BUN 14  CREATININE 0.93  CALCIUM 9.3   Liver  Function Tests:  Recent Labs Lab 10/02/12 2045  AST 33  ALT 28  ALKPHOS 79  BILITOT 0.3  PROT 7.0  ALBUMIN 3.7   No results found for this basename: LIPASE, AMYLASE,  in the last 168 hours No results found for this basename: AMMONIA,  in the last 168 hours CBC:  Recent Labs Lab 10/02/12 2045 10/03/12 0335 10/03/12 0705  WBC 11.1*  --  10.0  NEUTROABS 7.9*  --  8.0*  HGB 6.9* 7.7* 8.0*  HCT 20.6* 23.6* 23.5*  MCV 91.6  --  88.3  PLT 343  --  295   Cardiac Enzymes: No results found for this basename: CKTOTAL, CKMB, CKMBINDEX, TROPONINI,  in the last 168 hours BNP (last 3 results) No results found for this basename: PROBNP,  in the last 8760 hours CBG: No results found for this basename: GLUCAP,  in the last 168 hours  No results found for this or any previous visit (from the past 240 hour(s)).   Studies: Dg Chest Port 1 View  10/02/2012   CLINICAL DATA:  Fever.  EXAM: PORTABLE CHEST - 1 VIEW  COMPARISON:  07/19/2012.  FINDINGS: The cardiac silhouette, mediastinal and hilar contours are within normal limits and stable. The lungs are clear. No pleural effusion. The bony thorax is intact.  IMPRESSION: No acute cardiopulmonary findings.   Electronically Signed   By: Loralie Champagne M.D.   On: 10/02/2012 22:54    Scheduled  Meds: . cyclobenzaprine  10 mg Oral QHS  . docusate sodium  100 mg Oral BID  . donepezil  10 mg Oral QHS  . DULoxetine  60 mg Oral QPM  . gabapentin  800 mg Oral TID  . glycopyrrolate  2 mg Oral BID  . lisinopril  20 mg Oral q morning - 10a  . metoprolol succinate  50 mg Oral q morning - 10a  . pantoprazole (PROTONIX) IV  40 mg Intravenous Once  . pantoprazole (PROTONIX) IV  40 mg Intravenous Q12H  . simvastatin  20 mg Oral q1800   Continuous Infusions: . sodium chloride 200 mL (10/03/12 1447)    Principal Problem:   Upper GI bleed Active Problems:   CAD (coronary artery disease)   HTN (hypertension)   Anemia   Fever   Hyponatremia    Hypokalemia   Malnutrition of moderate degree    Time spent: 35    Grizel Vesely C  [facility] Pager [phone]. If 7PM-7AM, please contact night-coverage at [email or web address], password [facility] 10/03/2012, 5:03 PM  LOS: 1 day

## 2012-10-03 NOTE — Progress Notes (Signed)
INITIAL NUTRITION ASSESSMENT  Pt meets criteria for moderate MALNUTRITION in the context of chronic illness as evidenced by <75% estimated energy intake in the past month in addition to pt with visible mild/moderate muscle wasting in legs and arms.  DOCUMENTATION CODES Per approved criteria  -Non-severe (moderate) malnutrition in the context of chronic illness   INTERVENTION: - Diet advancement per MD - Discussed high calorie/protein diet and encouraged small frequent meals for diet to improve appetite. Provided handouts of this information.  - Will continue to monitor   NUTRITION DIAGNOSIS: Inadequate oral intake related to inability to eat as evidenced by NPO.   Goal: Advance diet as tolerated to regular diet  Monitor:  Weights, labs, diet advancement  Reason for Assessment: Nutrition risk   72 y.o. female  Admitting Dx: Upper GI bleed  ASSESSMENT: Pt admitted with 5 weeks of dark colored stools, found to have a GI bleed. Met with who reports poor intake for the past month with pt consuming only 1/3 of an egg and 1/4 of a piece of toast for breakfast, 1/2 of a peanut butter and jelly sandwich for lunch, and a small amount of food for dinner. Pt states if she tried to eat any more than that she would get nauseated. Pt c/o weakness PTA, states that she could barely walk. Pt unsure of usual weight. Plan is to get an EGD.   Nutrition Focused Physical Exam:  Subcutaneous Fat:  Orbital Region: WNL Upper Arm Region: mild/moderate wasting Thoracic and Lumbar Region: WNL  Muscle:  Temple Region: WNL Clavicle Bone Region: WNL Clavicle and Acromion Bone Region: WNL Scapular Bone Region: NA Dorsal Hand: NA Patellar Region: mild/moderate wasting Anterior Thigh Region: mild/moderate wasting Posterior Calf Region: mild/moderate wasting  Edema: None noted   Height: Ht Readings from Last 1 Encounters:  10/02/12 5\' 2"  (1.575 m)    Weight: Wt Readings from Last 1 Encounters:   10/02/12 187 lb 2.7 oz (84.9 kg)    Ideal Body Weight: 110 lb   % Ideal Body Weight: 170%  Wt Readings from Last 10 Encounters:  10/02/12 187 lb 2.7 oz (84.9 kg)  10/02/12 187 lb 2.7 oz (84.9 kg)  07/24/11 180 lb (81.647 kg)  06/25/11 205 lb 3.2 oz (93.078 kg)    Usual Body Weight: 180 lb July 2013  % Usual Body Weight: 104%  BMI:  Body mass index is 34.23 kg/(m^2). Class I obesity  Estimated Nutritional Needs: Kcal: 1325-1525 Protein: 50-60g Fluid: 1.3-1.5L/day  Skin: Intact   Diet Order: NPO  EDUCATION NEEDS: -Education needs addressed - educated pt on high calorie/protein small frequent meals to improve appetite    Intake/Output Summary (Last 24 hours) at 10/03/12 1246 Last data filed at 10/03/12 0101  Gross per 24 hour  Intake    800 ml  Output      0 ml  Net    800 ml    Last BM: 9/29  Labs:   Recent Labs Lab 10/02/12 2045  NA 129*  K 3.4*  CL 92*  CO2 22  BUN 14  CREATININE 0.93  CALCIUM 9.3  GLUCOSE 121*    CBG (last 3)  No results found for this basename: GLUCAP,  in the last 72 hours  Scheduled Meds: . cyclobenzaprine  10 mg Oral QHS  . docusate sodium  100 mg Oral BID  . donepezil  10 mg Oral QHS  . DULoxetine  60 mg Oral QPM  . gabapentin  800 mg Oral TID  .  glycopyrrolate  2 mg Oral BID  . lisinopril  20 mg Oral q morning - 10a  . metoprolol succinate  50 mg Oral q morning - 10a  . pantoprazole (PROTONIX) IV  40 mg Intravenous Once  . pantoprazole (PROTONIX) IV  40 mg Intravenous Q12H  . simvastatin  20 mg Oral q1800    Continuous Infusions:   Past Medical History  Diagnosis Date  . HTN (hypertension)   . Spinal stenosis of lumbar region   . Fibromyalgia   . Memory loss     Past Surgical History  Procedure Laterality Date  . Lumbar spine surgery  L3-L5 PLIF  . Cervical spine surgery  C4-C7 ACDF    Levon Hedger MS, RD, LDN [phone] Pager [phone] After Hours Pager

## 2012-10-03 NOTE — Progress Notes (Signed)
Utilization review completed.  

## 2012-10-03 NOTE — Op Note (Signed)
[facility] [address] [place], [address]   ENDOSCOPY PROCEDURE REPORT  PATIENT: Sarah [name], Sarah [name]  MR#: [id number] BIRTHDATE: [birth year] , 71  yrs. old GENDER: Female ENDOSCOPIST:Ryder Man, MD REFERRED BY:  Dr. Hinda Lenis PROCEDURE DATE:  10/03/2012 PROCEDURE:      upper endoscopy with biopsies ASA CLASS: INDICATIONS:   coffee-ground emesis, melenic stools, hemoglobin 6.9 in a patient on Mobic and aspirin MEDICATION:    fentanyl 50 mcg, Versed 6 mg IV TOPICAL ANESTHETIC:    Cetacaine spray  DESCRIPTION OF PROCEDURE:   the patient was brought from her hospital room to the [facility] long endoscopy unit. Written consent was provided and time out was performed. The nature, purpose, and risks of the procedure it been discussed with the patient just prior to the procedure. She received the above sedative medication and remained stable throughout the procedure.  The Pentax adult video endoscope was passed under direct vision. The larynx looked normal and the esophagus was entered without significant difficulty.  The mid esophagus and distal esophagus had linear streaks of exudate with surrounding mucosal edema and erythema, consistent with erosive esophagitis, probably related to the patient's recent problems with profuse vomiting. Biopsies were obtained from the areas of exudate and inflammation.There was no evidence of varices, infection, neoplasia, or classic Barrett's esophagus, nor any ring, stricture, or hiatal hernia.  The stomach was entered. It contained a small bilious residual, but there was no blood or coffee-ground material.  In the prepyloric area was a deep, 1.5 cm punched-out benign-appearing ulcer with a clean base. Antral biopsies were obtained to check for Helicobacter pylori infection prior to removal of the scope. The remainder of the stomach looked normal, including a retroflexed view of the cardia.  The pylorus and proximal  duodenal bulb were normal, but there was a fairly extensive, 2 x 2 centimeter diameter ulcer in the anterior aspect of the distal bulb. Just distal to that, at the junction of the bulb and second duodenum, there was another, somewhat smaller ulcer in at that location there was significant duodenal stenosis, and I was unable to pass the scope beyond that area into the second portion of the duodenum.  The patient tolerated the procedure quite well.     COMPLICATIONS: None  ENDOSCOPIC IMPRESSION:  1. No active bleeding or blood in the stomach at the time this procedure 2. 3 significant ulcers (1 prepyloric, 2 in the proximal duodenum) which might readily account for the patient's recent nausea, vomiting, coffee-ground emesis, melenic stool, and anemia although currently they have clean bases and no stigmata of recent hemorrhage. These are all benign in appearance and would be compatible with her history of exposure to aspirin and Mobic. 3. 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 4. Erosive esophagitis, most likely a consequence of her protracted vomiting. Reflux esophagitis would be less likely because the worst erosive changes were in the midesophagus, not the distal esophagus.  RECOMMENDATIONS:  1. Await pathology on gastric and esophageal biopsies. If Helicobacter pylori infection is present, I would favor treating it in view of the presence of ulcer disease. 2. I would favor intensive antipeptic therapy with twice-daily PPI and, for the time being, sucralfate 4 times a day as well. 3. I feel the patient's diet can be very gradually advanced. She will need to be monitored for signs of proximal small bowel obstruction (obstructive symptomatology following meals, such as postprandial fullness or vomiting). 4.  The patient should probably have endoscopic followup for confirmation of healing of the ulcers and to assess  for residual duodenal stenosis, which, if symptomatic, might potentially be addressed by balloon dilatation. 5. The patient should remain off aspirin and nonsteroidal anti-inflammatory drugs for the next month or so. Thereafter, especially if confirmation of healing of her ulcers has been achieved by repeat endoscopy, aspirin could be reintroduced with reasonable safety, as long as the patient were perpetually prophylaxed with a once daily PPI. 6. If possible, it would be best for this patient to avoid regular use of nonsteroidal anti-inflammatory drugs for the rest of her life, although if their use were critical to her management, she could probably get away with that as long as she was on twice daily PPI prophylaxis.    _______________________________ Rosalie DoctorBernette Redbird, MD 10/03/2012 4:05 PM    PATIENT NAME:  Sarah [name], Sarah [name] MR#: [id number]

## 2012-10-03 NOTE — Interval H&P Note (Signed)
History and Physical Interval Note:  10/03/2012 3:25 PM  Sarah [name]  has presented today for surgery, with the diagnosis of GI bleeding  The various methods of treatment have been discussed with the patient. After consideration of risks, benefits and other options for treatment, the patient has consented to  Procedure(s): ESOPHAGOGASTRODUODENOSCOPY (EGD) (N/A) as a surgical intervention .  The patient's history has been reviewed, patient examined, no change in status, stable for surgery.  I have reviewed the patient's chart and labs.  Questions were answered to the patient's satisfaction.     Florencia Reasons

## 2012-10-04 DIAGNOSIS — F0390 Unspecified dementia without behavioral disturbance: Secondary | ICD-10-CM

## 2012-10-04 LAB — HEMOGLOBIN AND HEMATOCRIT, BLOOD
HCT: 26.6 % — ABNORMAL LOW (ref 36.0–46.0)
Hemoglobin: 8.9 g/dL — ABNORMAL LOW (ref 12.0–15.0)

## 2012-10-04 LAB — BASIC METABOLIC PANEL
CO2: 22 mEq/L (ref 19–32)
Chloride: 101 mEq/L (ref 96–112)
Creatinine, Ser: 1.11 mg/dL — ABNORMAL HIGH (ref 0.50–1.10)
GFR calc Af Amer: 56 mL/min — ABNORMAL LOW (ref >90)
Potassium: 3.5 mEq/L (ref 3.5–5.1)
Sodium: 132 mEq/L — ABNORMAL LOW (ref 135–145)

## 2012-10-04 LAB — CBC
HCT: 20.9 % — ABNORMAL LOW (ref 36.0–46.0)
Hemoglobin: 6.8 g/dL — CL (ref 12.0–15.0)
MCH: 29.1 pg (ref 26.0–34.0)
MCHC: 32.5 g/dL (ref 30.0–36.0)
MCV: 89.3 fL (ref 78.0–100.0)
Platelets: 239 10*3/uL (ref 150–400)
RDW: 22.7 % — ABNORMAL HIGH (ref 11.5–15.5)
WBC: 7.5 10*3/uL (ref 4.0–10.5)

## 2012-10-04 LAB — LACTIC ACID, PLASMA: Lactic Acid, Venous: 1.1 mmol/L (ref 0.5–2.2)

## 2012-10-04 LAB — PREPARE RBC (CROSSMATCH)

## 2012-10-04 MED ORDER — SODIUM CHLORIDE 0.9 % IV SOLN
8.0000 mg/h | INTRAVENOUS | Status: DC
  Administered 2012-10-04: 8 mg/h via INTRAVENOUS
  Filled 2012-10-04 (×5): qty 80

## 2012-10-04 MED ORDER — METOPROLOL SUCCINATE ER 50 MG PO TB24
50.0000 mg | ORAL_TABLET | Freq: Every morning | ORAL | Status: DC
  Administered 2012-10-05 – 2012-10-06 (×2): 50 mg via ORAL
  Filled 2012-10-04 (×3): qty 1

## 2012-10-04 NOTE — Progress Notes (Addendum)
Patient ID: Sarah [name], female   DOB: Apr 23, 1940, 72 y.o.   MRN: [id number] [facility] Gastroenterology Progress Note  Sarah [name] 72 y.o. 1940-09-01   Subjective: No BM overnight. Hgb 6.8 this morning. Denies abdominal pain, nausea, or vomiting. Tolerating clears.  Objective: Vital signs in last 24 hours: Filed Vitals:   10/04/12 1010  BP: 95/60  Pulse: 80  Temp: 100.5  Resp: 18    Physical Exam: Gen: alert, no acute distress, elderly, obese Abd: epigastric and periumbilical tenderness with voluntary guarding, soft, nondistended, +BS  Lab Results:  Recent Labs  10/02/12 2045 10/04/12 0531  NA 129* 132*  K 3.4* 3.5  CL 92* 101  CO2 22 22  GLUCOSE 121* 93  BUN 14 16  CREATININE 0.93 1.11*  CALCIUM 9.3 7.8*    Recent Labs  10/02/12 2045  AST 33  ALT 28  ALKPHOS 79  BILITOT 0.3  PROT 7.0  ALBUMIN 3.7    Recent Labs  10/02/12 2045  10/03/12 0705 10/04/12 0531  WBC 11.1*  --  10.0 7.5  NEUTROABS 7.9*  --  8.0*  --   HGB 6.9*  < > 8.0* 6.8*  HCT 20.6*  < > 23.5* 20.9*  MCV 91.6  --  88.3 89.3  PLT 343  --  295 239  < > = values in this interval not displayed.  Recent Labs  10/02/12 2045  LABPROT 13.4  INR 1.04      Assessment/Plan: 72 yo with duodenal ulcers and functional obstruction (duodenal stenosis) noted and likely source of recent bleeding but needs a colonoscopy in the near future. EGD biopsies pending. Will likely do colonoscopy as an outpt because she won't be able to tolerate a prep anytime soon with this stenosis until ulcers begin to heal. Will keep on clears only today. Change to Protonix infusion. [facility] ordered 1 U PRBCs for her today. Will follow.   Sarah [name] C. 10/04/2012, 10:31 AM

## 2012-10-04 NOTE — Progress Notes (Signed)
[facility] PROGRESS NOTE  Sarah [name] ZOX:[id number] DOB: 1940/06/27 DOA: 10/02/2012 PCP: [name], MD  Assessment/Plan: Upper GI bleed  -EGD 10/3 showed ulcers- 3 significant ulcers (1 prepyloric, 2 in the distal duodenum) With erosive esophagitis and duodenal stenosis, path/studies sent -pt placed on Protonix drip today per GI, continue carafate. No NSAIDs -Per GI she will likely need colonoscopy outpatient -Tolerating clears, GI  Following and to advance when appropriate -Hemoglobin down to 6.8 again this a.m. and transfuse one more unit of PRBCs -appreciate GI assitance>>to follow Active Problems:  CAD (coronary artery disease)  -pt chest pain free HTN (hypertension)  -BP stable,  Cont current meds  Anemia/ABLA  -secondary to #1 -hgb dropped to 6.8 this a.m., s/p transfusion of 1u PRBC on admit, and transfused another unit this a.m. -follow and further transfuse appropriate Hyponatremia  -likely due to volume depletion -Improving with hydration Hypokalemia -k replaced, follow and recheck Fever -unclear etiology, trending down but still 100.5 this a.m. -UA and CXR neg -hold abx for now as she remains hemodynamically stable at this time, and nontoxic appearing -Will repeat blood cultures, obtain urine cultures as well. The blood cultures of 10/3 so far with no growth  Code Status: full Family Communication: husband at bedside Disposition Plan: to home when medically ready   Consultants:  GI  Procedures:  For EGD today  Antibiotics:  none  HPI/Subjective: Pt denies N/V, abdominal pain and no melena.  Objective: Filed Vitals:   10/04/12 1405  BP: 119/75  Pulse: 64  Temp: 97.7 F (36.5 C)  Resp: 12    Intake/Output Summary (Last 24 hours) at 10/04/12 1430 Last data filed at 10/04/12 1410  Gross per 24 hour  Intake 2424.83 ml  Output      0 ml  Net 2424.83 ml   Filed Weights   10/02/12 2320  Weight: 84.9 kg (187 lb 2.7  oz)    Exam:  General: alert & oriented x 3 In NAD Cardiovascular: RRR, nl S1 s2 Respiratory: CTAB Abdomen: soft +BS NT/ND, no masses palpable Extremities: No cyanosis and no edema      Data Reviewed: Basic Metabolic Panel:  Recent Labs Lab 10/02/12 2045 10/04/12 0531  NA 129* 132*  K 3.4* 3.5  CL 92* 101  CO2 22 22  GLUCOSE 121* 93  BUN 14 16  CREATININE 0.93 1.11*  CALCIUM 9.3 7.8*   Liver Function Tests:  Recent Labs Lab 10/02/12 2045  AST 33  ALT 28  ALKPHOS 79  BILITOT 0.3  PROT 7.0  ALBUMIN 3.7   No results found for this basename: LIPASE, AMYLASE,  in the last 168 hours No results found for this basename: AMMONIA,  in the last 168 hours CBC:  Recent Labs Lab 10/02/12 2045 10/03/12 0335 10/03/12 0705 10/04/12 0531  WBC 11.1*  --  10.0 7.5  NEUTROABS 7.9*  --  8.0*  --   HGB 6.9* 7.7* 8.0* 6.8*  HCT 20.6* 23.6* 23.5* 20.9*  MCV 91.6  --  88.3 89.3  PLT 343  --  295 239   Cardiac Enzymes: No results found for this basename: CKTOTAL, CKMB, CKMBINDEX, TROPONINI,  in the last 168 hours BNP (last 3 results) No results found for this basename: PROBNP,  in the last 8760 hours CBG: No results found for this basename: GLUCAP,  in the last 168 hours  Recent Results (from the past 240 hour(s))  CULTURE, BLOOD (ROUTINE X 2)     Status: None  Collection Time    10/03/12  3:35 AM      Result Value Range Status   Specimen Description BLOOD LEFT ANTECUBITAL   Final   Special Requests BOTTLES DRAWN AEROBIC AND ANAEROBIC [facility]   Final   Culture  Setup Time     Final   Value: 10/03/2012 08:56     Performed at [facility]   Culture     Final   Value:        BLOOD CULTURE RECEIVED NO GROWTH TO DATE CULTURE WILL BE HELD FOR 5 DAYS BEFORE ISSUING A FINAL NEGATIVE REPORT     Performed at [facility]   Report Status PENDING   Incomplete  CULTURE, BLOOD (ROUTINE X 2)     Status: None   Collection Time    10/03/12  3:40 AM      Result  Value Range Status   Specimen Description BLOOD LEFT HAND   Final   Special Requests BOTTLES DRAWN AEROBIC ONLY 1 CC   Final   Culture  Setup Time     Final   Value: 10/03/2012 08:56     Performed at [facility]   Culture     Final   Value:        BLOOD CULTURE RECEIVED NO GROWTH TO DATE CULTURE WILL BE HELD FOR 5 DAYS BEFORE ISSUING A FINAL NEGATIVE REPORT     Performed at [facility]   Report Status PENDING   Incomplete     Studies: Dg Chest Port 1 View  10/02/2012   CLINICAL DATA:  Fever.  EXAM: PORTABLE CHEST - 1 VIEW  COMPARISON:  07/19/2012.  FINDINGS: The cardiac silhouette, mediastinal and hilar contours are within normal limits and stable. The lungs are clear. No pleural effusion. The bony thorax is intact.  IMPRESSION: No acute cardiopulmonary findings.   Electronically Signed   By: Loralie Champagne M.D.   On: 10/02/2012 22:54    Scheduled Meds: . cyclobenzaprine  10 mg Oral QHS  . docusate sodium  100 mg Oral BID  . donepezil  10 mg Oral QHS  . DULoxetine  60 mg Oral QPM  . gabapentin  800 mg Oral TID  . glycopyrrolate  2 mg Oral BID  . lisinopril  20 mg Oral q morning - 10a  . metoprolol succinate  50 mg Oral q morning - 10a  . simvastatin  20 mg Oral q1800  . sucralfate  1 g Oral TID WC & HS   Continuous Infusions: . pantoprozole (PROTONIX) infusion      Principal Problem:   Upper GI bleed Active Problems:   CAD (coronary artery disease)   HTN (hypertension)   Anemia   Fever   Hyponatremia   Hypokalemia   Malnutrition of moderate degree    Time spent: 35    Brittne Kawasaki C  [facility] Pager [phone]. If 7PM-7AM, please contact night-coverage at [email or web address], password [facility] 10/04/2012, 2:30 PM  LOS: 2 days

## 2012-10-04 NOTE — Progress Notes (Signed)
CRITICAL VALUE ALERT  Critical value received:  hgb 6.8  Date of notification:  10/4  Time of notification:  0618  Critical value read back: yes  Nurse who received alert:  Aissata Wilmore RN  MD notified (1st page): Lenny Pastel assessing patient at time of critical lab value phone call. Lenny Pastel verbally informed about hgb, order to transfuse placed.

## 2012-10-05 DIAGNOSIS — R7881 Bacteremia: Secondary | ICD-10-CM

## 2012-10-05 LAB — TYPE AND SCREEN: Unit division: 0

## 2012-10-05 LAB — BASIC METABOLIC PANEL
BUN: 11 mg/dL (ref 6–23)
Calcium: 8.3 mg/dL — ABNORMAL LOW (ref 8.4–10.5)
Chloride: 105 mEq/L (ref 96–112)
Creatinine, Ser: 0.78 mg/dL (ref 0.50–1.10)
GFR calc Af Amer: >90 mL/min (ref >90)
Potassium: 3.7 mEq/L (ref 3.5–5.1)

## 2012-10-05 LAB — URINE CULTURE

## 2012-10-05 LAB — CBC
HCT: 26.1 % — ABNORMAL LOW (ref 36.0–46.0)
Hemoglobin: 8.7 g/dL — ABNORMAL LOW (ref 12.0–15.0)
MCHC: 33.3 g/dL (ref 30.0–36.0)
Platelets: 275 10*3/uL (ref 150–400)
RDW: 20.2 % — ABNORMAL HIGH (ref 11.5–15.5)
WBC: 5.5 10*3/uL (ref 4.0–10.5)

## 2012-10-05 MED ORDER — PANTOPRAZOLE SODIUM 40 MG IV SOLR
40.0000 mg | Freq: Two times a day (BID) | INTRAVENOUS | Status: DC
  Administered 2012-10-05 – 2012-10-06 (×2): 40 mg via INTRAVENOUS
  Filled 2012-10-05 (×4): qty 40

## 2012-10-05 MED ORDER — DEXTROSE 5 % IV SOLN
2.0000 g | Freq: Two times a day (BID) | INTRAVENOUS | Status: DC
  Administered 2012-10-05 – 2012-10-06 (×2): 2 g via INTRAVENOUS
  Filled 2012-10-05 (×3): qty 2

## 2012-10-05 NOTE — Progress Notes (Signed)
[facility] PROGRESS NOTE  Sarah [name]:[id number] DOB: [birth year] DOA: 10/02/2012 PCP: [name], MD  Assessment/Plan: Upper GI bleed  -EGD 10/3 showed ulcers- 3 significant ulcers (1 prepyloric, 2 in the distal duodenum) With erosive esophagitis and duodenal stenosis, path/studies sent -pt placed on Protonix drip today per GI, continue carafate. No NSAIDs -Per GI she will likely need colonoscopy outpatient -Tolerating clears, >> discussed with Dr. Bosie Clos who is okay with  advancing diet as tolerated -Hemoglobin down to 6.8 again this a.m. and transfuse one more unit of PRBCs -appreciate GI assitance>>to follow Active Problems:  CAD (coronary artery disease)  -pt chest pain free HTN (hypertension)  -BP stable,  Cont current meds  Anemia/ABLA  -secondary to #1 -hgb dropped to 6.8 this a.m., s/p transfusion of 1u PRBC on admit, and transfused another unit this a.m. -follow and further transfuse appropriate Hyponatremia  -likely due to volume depletion -resolved with hydration Hypokalemia -Resolved Fever/bacteremia-GPR Vs cocci(prelim culture called to nurse this afternoon)-UA and CXR neg -Start empiric antibiotics with cefepime, follow and further adjust pending culture ID and sensitivity Code Status: full Family Communication: husband at bedside Disposition Plan: to home when medically ready   Consultants:  GI  Procedures:  For EGD today  Antibiotics:  none  HPI/Subjective: Pt denies N/V, abdominal pain and gross bleeding. Requesting more food. Per nursing lab called with positive blood culture.  Objective: Filed Vitals:   10/05/12 0918  BP: 111/66  Pulse: 79  Temp:   Resp:     Intake/Output Summary (Last 24 hours) at 10/05/12 1346 Last data filed at 10/05/12 0700  Gross per 24 hour  Intake   1115 ml  Output      0 ml  Net   1115 ml   Filed Weights   10/02/12 2320  Weight: 84.9 kg (187 lb 2.7 oz)    Exam:  General:  alert & oriented x 3 In NAD Cardiovascular: RRR, nl S1 s2 Respiratory: CTAB Abdomen: soft +BS NT/ND, no masses palpable Extremities: No cyanosis and no edema      Data Reviewed: Basic Metabolic Panel:  Recent Labs Lab 10/02/12 2045 10/04/12 0531 10/05/12 0520  NA 129* 132* 138  K 3.4* 3.5 3.7  CL 92* 101 105  CO2 22 22 22   GLUCOSE 121* 93 102*  BUN 14 16 11   CREATININE 0.93 1.11* 0.78  CALCIUM 9.3 7.8* 8.3*   Liver Function Tests:  Recent Labs Lab 10/02/12 2045  AST 33  ALT 28  ALKPHOS 79  BILITOT 0.3  PROT 7.0  ALBUMIN 3.7   No results found for this basename: LIPASE, AMYLASE,  in the last 168 hours No results found for this basename: AMMONIA,  in the last 168 hours CBC:  Recent Labs Lab 10/02/12 2045 10/03/12 0335 10/03/12 0705 10/04/12 0531 10/04/12 2215 10/05/12 0520  WBC 11.1*  --  10.0 7.5  --  5.5  NEUTROABS 7.9*  --  8.0*  --   --   --   HGB 6.9* 7.7* 8.0* 6.8* 8.9* 8.7*  HCT 20.6* 23.6* 23.5* 20.9* 26.6* 26.1*  MCV 91.6  --  88.3 89.3  --  88.8  PLT 343  --  295 239  --  275   Cardiac Enzymes: No results found for this basename: CKTOTAL, CKMB, CKMBINDEX, TROPONINI,  in the last 168 hours BNP (last 3 results) No results found for this basename: PROBNP,  in the last 8760 hours CBG: No results found for this basename: GLUCAP,  in the last 168 hours  Recent Results (from the past 240 hour(s))  CULTURE, BLOOD (ROUTINE X 2)     Status: None   Collection Time    10/03/12  3:35 AM      Result Value Range Status   Specimen Description BLOOD LEFT ANTECUBITAL   Final   Special Requests BOTTLES DRAWN AEROBIC AND ANAEROBIC [facility]   Final   Culture  Setup Time     Final   Value: 10/03/2012 08:56     Performed at [facility]   Culture     Final   Value:        BLOOD CULTURE RECEIVED NO GROWTH TO DATE CULTURE WILL BE HELD FOR 5 DAYS BEFORE ISSUING A FINAL NEGATIVE REPORT     Performed at [facility]   Report Status PENDING    Incomplete  CULTURE, BLOOD (ROUTINE X 2)     Status: None   Collection Time    10/03/12  3:40 AM      Result Value Range Status   Specimen Description BLOOD LEFT HAND   Final   Special Requests BOTTLES DRAWN AEROBIC ONLY 1 CC   Final   Culture  Setup Time     Final   Value: 10/03/2012 08:56     Performed at [facility]   Culture     Final   Value:        BLOOD CULTURE RECEIVED NO GROWTH TO DATE CULTURE WILL BE HELD FOR 5 DAYS BEFORE ISSUING A FINAL NEGATIVE REPORT     Performed at [facility]   Report Status PENDING   Incomplete  CULTURE, BLOOD (ROUTINE X 2)     Status: None   Collection Time    10/04/12  1:00 PM      Result Value Range Status   Specimen Description BLOOD RIGHT ARM   Final   Special Requests BOTTLES DRAWN AEROBIC ONLY 2CC   Final   Culture  Setup Time     Final   Value: 10/04/2012 19:06     Performed at [facility]   Culture     Final   Value:        BLOOD CULTURE RECEIVED NO GROWTH TO DATE CULTURE WILL BE HELD FOR 5 DAYS BEFORE ISSUING A FINAL NEGATIVE REPORT     Performed at [facility]   Report Status PENDING   Incomplete  CULTURE, BLOOD (ROUTINE X 2)     Status: None   Collection Time    10/04/12  1:15 PM      Result Value Range Status   Specimen Description BLOOD LEFT HAND   Final   Special Requests BOTTLES DRAWN AEROBIC ONLY 2CC   Final   Culture  Setup Time     Final   Value: 10/04/2012 19:07     Performed at [facility]   Culture     Final   Value:        BLOOD CULTURE RECEIVED NO GROWTH TO DATE CULTURE WILL BE HELD FOR 5 DAYS BEFORE ISSUING A FINAL NEGATIVE REPORT     Performed at [facility]   Report Status PENDING   Incomplete     Studies: No results found.  Scheduled Meds: . cyclobenzaprine  10 mg Oral QHS  . docusate sodium  100 mg Oral BID  . donepezil  10 mg Oral QHS  . DULoxetine  60 mg Oral QPM  . gabapentin  800 mg Oral TID  . glycopyrrolate  2 mg Oral BID  . lisinopril  20 mg  Oral q morning - 10a  . metoprolol succinate  50 mg Oral q morning - 10a  . simvastatin  20 mg Oral q1800  . sucralfate  1 g Oral TID WC & HS   Continuous Infusions: . pantoprozole (PROTONIX) infusion 8 mg/hr (10/04/12 1830)    Principal Problem:   Upper GI bleed Active Problems:   CAD (coronary artery disease)   HTN (hypertension)   Anemia   Fever   Hyponatremia   Hypokalemia   Malnutrition of moderate degree    Time spent: 35    Alesia Oshields C  [facility] Pager [phone]. If 7PM-7AM, please contact night-coverage at [email or web address], password [facility] 10/05/2012, 1:46 PM  LOS: 3 days

## 2012-10-05 NOTE — Progress Notes (Signed)
Patient ID: Sarah [name], female   DOB: 01/28/1940, 72 y.o.   MRN: [id number] [facility] Gastroenterology Progress Note  SHENAE BONANNO 72 y.o. 01-07-1940   Subjective: States had bad day today with back pain and abdominal pain. Anxiously wants to go home.  Objective: Vital signs: Filed Vitals:   10/05/12 1449  BP: 147/71  Pulse: 83  Temp: 98.1 F (36.7 C)  Resp: 18    Physical Exam: Gen: alert, no acute distress  Abd: diffusely tender with guarding, soft, nondistended, +BS  Lab Results:  Recent Labs  10/04/12 0531 10/05/12 0520  NA 132* 138  K 3.5 3.7  CL 101 105  CO2 22 22  GLUCOSE 93 102*  BUN 16 11  CREATININE 1.11* 0.78  CALCIUM 7.8* 8.3*    Recent Labs  10/02/12 2045  AST 33  ALT 28  ALKPHOS 79  BILITOT 0.3  PROT 7.0  ALBUMIN 3.7    Recent Labs  10/02/12 2045  10/03/12 0705 10/04/12 0531 10/04/12 2215 10/05/12 0520  WBC 11.1*  --  10.0 7.5  --  5.5  NEUTROABS 7.9*  --  8.0*  --   --   --   HGB 6.9*  < > 8.0* 6.8* 8.9* 8.7*  HCT 20.6*  < > 23.5* 20.9* 26.6* 26.1*  MCV 91.6  --  88.3 89.3  --  88.8  PLT 343  --  295 239  --  275  < > = values in this interval not displayed.    Assessment/Plan: 72 yo with duodenal ulcers and functional obstruction (duodenal stenosis) noted and likely source of recent bleeding but needs a colonoscopy in the near future. EGD biopsies pending. Will likely do colonoscopy as an outpt because she won't be able to tolerate a prep anytime soon with this stenosis until ulcers begin to heal. Hgb 8.7. Advance diet. Will change to IV PPI Q 12 hours and when discharged needs PPI PO BID for 3 months and then QD. Defer discharge timing to [facility] but no evidence of further bleeding and if she can eat solid food without N/V/abdominal pain then ok to d/c tomorrow from GI standpoint with f/u with Dr. Matthias Hughs as outpt. Will sign off. Call with questions.    Shirell Struthers C. 10/05/2012, 5:45 PM

## 2012-10-05 NOTE — Progress Notes (Signed)
CRITICAL VALUE ALERT  Critical value received:  + gram rods   Date of notification:  10/05/12  Time of notification:  1510  Critical value read back: yes  Nurse who received alert: Haniah Penny, lpn  MD notified (1st page): Viyouh  Time of first page: 1525  MD notified (2nd page):  Time of second page:  Responding MD: Donna Bernard  Time MD responded: 1535

## 2012-10-06 ENCOUNTER — Encounter (HOSPITAL_COMMUNITY): Payer: Self-pay | Admitting: Gastroenterology

## 2012-10-06 DIAGNOSIS — I10 Essential (primary) hypertension: Secondary | ICD-10-CM

## 2012-10-06 DIAGNOSIS — R4182 Altered mental status, unspecified: Secondary | ICD-10-CM

## 2012-10-06 LAB — CBC
HCT: 26.4 % — ABNORMAL LOW (ref 36.0–46.0)
Hemoglobin: 8.7 g/dL — ABNORMAL LOW (ref 12.0–15.0)
MCHC: 33 g/dL (ref 30.0–36.0)
MCV: 89.5 fL (ref 78.0–100.0)
Platelets: 262 10*3/uL (ref 150–400)
RBC: 2.95 MIL/uL — ABNORMAL LOW (ref 3.87–5.11)
RDW: 19.6 % — ABNORMAL HIGH (ref 11.5–15.5)
WBC: 4.6 10*3/uL (ref 4.0–10.5)

## 2012-10-06 LAB — BASIC METABOLIC PANEL
BUN: 8 mg/dL (ref 6–23)
CO2: 22 mEq/L (ref 19–32)
Chloride: 107 mEq/L (ref 96–112)
Creatinine, Ser: 0.7 mg/dL (ref 0.50–1.10)
GFR calc Af Amer: >90 mL/min (ref >90)
GFR calc non Af Amer: 85 mL/min — ABNORMAL LOW (ref >90)
Sodium: 139 mEq/L (ref 135–145)

## 2012-10-06 MED ORDER — ASPIRIN EC 81 MG PO TBEC: 81.0000 mg | DELAYED_RELEASE_TABLET | Freq: Every day | ORAL | Status: DC

## 2012-10-06 MED ORDER — SUCRALFATE 1 GM/10ML PO SUSP: 1.0000 g | Freq: Three times a day (TID) | ORAL | Status: DC

## 2012-10-06 MED ORDER — PANTOPRAZOLE SODIUM 40 MG PO TBEC: 40.0000 mg | DELAYED_RELEASE_TABLET | Freq: Two times a day (BID) | ORAL | Status: DC

## 2012-10-06 MED ORDER — PANTOPRAZOLE SODIUM 40 MG PO TBEC: DELAYED_RELEASE_TABLET | ORAL | Status: DC

## 2012-10-06 NOTE — ED Provider Notes (Signed)
See prior note   Ward Givens, MD 10/06/12 [phone]

## 2012-10-06 NOTE — Progress Notes (Signed)
10/06/12 1400  Clinical Encounter Type  Visited With Patient and family together (husband (married 56 years) and son Sarah [name])  Visit Type Spiritual support  Referral From Nurse  Spiritual Encounters  Spiritual Needs Emotional   Sarah [name] and her husband expressed such gratitude for the caring spirit with which all of the staff has supported them.  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  Provided pastoral presence, reflective listening, and affirmation.  Family is aware of ongoing chaplain availability in case of future hospital visits or stays.  [address] [place], [place] [phone]

## 2012-10-06 NOTE — Discharge Summary (Signed)
Physician Discharge Summary  Sarah [name] JYN:[id number] DOB: 16-Nov-1940 DOA: 10/02/2012  PCP: [name], MD  Admit date: 10/02/2012 Discharge date: 10/06/2012  Time spent: >65minutes  Recommendations for Outpatient Follow-up:  Follow-up Information   Follow up with READE,ROBERT Lyn Hollingshead, MD. (in 1week, Hgb on follow up)    Specialty:  Family Medicine   Contact information:   [address] [place] [address] [phone]       Follow up with Florencia Reasons, MD. (in 1-2weeks, call for appt upon discharge)    Specialty:  Gastroenterology   Contact information:   [address] [place] [address] [phone]        Followup labs with PCP -CBC and blood culture>>see details below  Discharge Diagnoses:  Principal Problem:   Upper GI bleed Active Problems:   CAD (coronary artery disease)   HTN (hypertension)   Anemia   Fever/? bacteremia   Hyponatremia   Hypokalemia   Malnutrition of moderate degree   Discharge Condition: Improved/stable  Diet recommendation: Heart healthy  Filed Weights   10/02/12 2320  Weight: 84.9 kg (187 lb 2.7 oz)    History of present illness:  Sarah [name] is a 72 y.o. female With a hx of chronic back pain treated w/ chronic NSAIDs who initially presented to her PCP today, where she was found to have a hgb of around 6. On further questioning, the patient reports having "dark" colored stools starting five weeks prior to admit. No CP or syncope. Pt dose complain of increased sob and chills. In the ED, the patient was noted to have a hgb of 6.9. She was given one unit of prbc's. GI was consulted through the ED and she was [facility] for further evaluation and management.   Hospital Course:  Upper GI bleed  -As discussed above, patient had presented with the hemoglobin of about 6 from her PCPs office and admitted to [facility] -GI was consulted, her H&H was monitored and  she was transfused packed red blood cells as appropriate>> please see below -GI followed and did EGD 10/3 showed ulcers- 3 significant ulcers (1 prepyloric, 2 in the distal duodenum)  With erosive esophagitis and duodenal stenosis, path/studies sent  -pt placed on Protonix drip per GI and they followed and changed it to Protonix, she was also placed on carafate. No NSAIDs  -Patient is tolerating by mouth/solids at this time and eager to be discharged -She has been instructed to stay off aspirin for one more week and it is to be decreased to 81 mg daily when it is resumed -She'll be discharged on Protonix twice a day for 3 months and then daily thereafter -Per GI she will likely need colonoscopy outpatient>> she is to followup with Dr. Matthias Hughs -Her hemoglobin has remained stable at 8.7 today and she's had no further gross evidence of bleeding Active Problems:  CAD (coronary artery disease)  -pt chest pain free  HTN (hypertension)  -BP stable, Cont current meds  Anemia/ABLA  -secondary to #1  -hgb dropped to 6.8 a.m of 10/4 and was transfused another unit of packed red blood cells, patient had been transfused a of 1u PRBC on admit as well. Patient's hemoglobin has remained stable(after this total of 2 units of pack red blood cells) today on followup-8.7 -She has had no further bleeding and she is to followup with GI and PCP Hyponatremia  -likely due to volume depletion  -resolved with hydration  Hypokalemia  -  Secondary to GI losses, Resolved  Fever/?bacteremia-GPR Vs cocci(prelim culture called to nurse this afternoon)-UA and CXR neg  -Empiric antibiotics with cefepime and was initiated on 10/5 when the lab called and gave a prelim report of gram-positive organism based on staining, on followup today 10/60 with lab a state so far and no bacteria growing and is still holding it to follow up  for any growth follow. Discussed this findings with infectious disease/Dr., and he recommends to  discontinue the antibiotics the sensation had defervesced prior to antibiotic initiation and was remaining afebrile with no leukocytosis and hemodynamically stable. Also discussed all of this information with patient and her family and they are aware that in the event that any bacteria grows they would need to be called/followup with PCP since outpatient is very anxious to go home today.   Procedures:  EGD  Consultations:  GI  Discharge Exam: Filed Vitals:   10/06/12 0500  BP: 166/87  Pulse: 77  Temp: 98.5 F (36.9 C)  Resp: 16   Exam:  General: alert & oriented x 3 In NAD  Cardiovascular: RRR, nl S1 s2  Respiratory: CTAB  Abdomen: soft +BS NT/ND, no masses palpable  Extremities: No cyanosis and no edema     Discharge Instructions  Discharge Orders   Future Orders Complete By Expires   Diet - low sodium heart healthy  As directed    Increase activity slowly  As directed        Medication List    STOP taking these medications       aspirin 325 MG tablet  Replaced by:  aspirin EC 81 MG tablet     meloxicam 15 MG tablet  Commonly known as:  MOBIC      TAKE these medications       acetaminophen 500 MG tablet  Commonly known as:  TYLENOL  Take 500 mg by mouth every 6 (six) hours as needed for pain.     ALPRAZolam 0.25 MG tablet  Commonly known as:  XANAX  Take 0.25 mg by mouth 2 (two) times daily as needed. For anxiety.     aspirin EC 81 MG tablet  Take 1 tablet (81 mg total) by mouth daily.  Start taking on:  10/13/2012     cyclobenzaprine 10 MG tablet  Commonly known as:  FLEXERIL  Take 10 mg by mouth at bedtime.     donepezil 10 MG tablet  Commonly known as:  ARICEPT  Take 1 tablet (10 mg total) by mouth at bedtime.     doxylamine (Sleep) 25 MG tablet  Commonly known as:  UNISOM  Take 50 mg by mouth at bedtime as needed for sleep.     DULoxetine 60 MG capsule  Commonly known as:  CYMBALTA  Take 60 mg by mouth every evening.     fish  oil-omega-3 fatty acids 1000 MG capsule  Take 2 g by mouth daily.     furosemide 20 MG tablet  Commonly known as:  LASIX  Take 20 mg by mouth daily.     gabapentin 800 MG tablet  Commonly known as:  NEURONTIN  Take 800 mg by mouth 3 (three) times daily.     glycopyrrolate 1 MG tablet  Commonly known as:  ROBINUL  Take 2 mg by mouth 2 (two) times daily.     lisinopril 20 MG tablet  Commonly known as:  PRINIVIL,ZESTRIL  Take 20 mg by mouth every morning.     metoprolol succinate 50  MG 24 hr tablet  Commonly known as:  TOPROL-XL  Take 50 mg by mouth every morning. Take with or immediately following a meal.     multivitamin with minerals Tabs tablet  Take 1 tablet by mouth daily.     pantoprazole 40 MG tablet  Commonly known as:  PROTONIX  Take 1 tablet (40 mg total) by mouth 2 (two) times daily before a meal.     pravastatin 40 MG tablet  Commonly known as:  PRAVACHOL  Take 40 mg by mouth at bedtime.     simethicone 125 MG chewable tablet  Commonly known as:  MYLICON  Chew 125 mg by mouth every 6 (six) hours as needed for flatulence.     sucralfate 1 GM/10ML suspension  Commonly known as:  CARAFATE  Take 10 mLs (1 g total) by mouth 4 (four) times daily -  with meals and at bedtime.     terbinafine 1 % cream  Commonly known as:  LAMISIL  Apply 1 application topically 2 (two) times daily.     terbinafine 250 MG tablet  Commonly known as:  LAMISIL  Take 250 mg by mouth daily.     traMADol 50 MG tablet  Commonly known as:  ULTRAM  Take 1 tablet (50 mg total) by mouth every 8 (eight) hours as needed for pain. For pain.     vitamin C 100 MG tablet  Take 100 mg by mouth daily.       No Known Allergies    The results of significant diagnostics from this hospitalization (including imaging, microbiology, ancillary and laboratory) are listed below for reference.    Significant Diagnostic Studies: Dg Chest Port 1 View  10/02/2012   CLINICAL DATA:  Fever.  EXAM:  PORTABLE CHEST - 1 VIEW  COMPARISON:  07/19/2012.  FINDINGS: The cardiac silhouette, mediastinal and hilar contours are within normal limits and stable. The lungs are clear. No pleural effusion. The bony thorax is intact.  IMPRESSION: No acute cardiopulmonary findings.   Electronically Signed   By: Loralie Champagne M.D.   On: 10/02/2012 22:54    Microbiology: Recent Results (from the past 240 hour(s))  CULTURE, BLOOD (ROUTINE X 2)     Status: None   Collection Time    10/03/12  3:35 AM      Result Value Range Status   Specimen Description BLOOD LEFT ANTECUBITAL   Final   Special Requests BOTTLES DRAWN AEROBIC AND ANAEROBIC [facility]   Final   Culture  Setup Time     Final   Value: 10/03/2012 08:56     Performed at [facility]   Culture     Final   Value:        BLOOD CULTURE RECEIVED NO GROWTH TO DATE CULTURE WILL BE HELD FOR 5 DAYS BEFORE ISSUING A FINAL NEGATIVE REPORT     Performed at [facility]   Report Status PENDING   Incomplete  CULTURE, BLOOD (ROUTINE X 2)     Status: None   Collection Time    10/03/12  3:40 AM      Result Value Range Status   Specimen Description BLOOD LEFT HAND   Final   Special Requests BOTTLES DRAWN AEROBIC ONLY 1 CC   Final   Culture  Setup Time     Final   Value: 10/03/2012 08:56     Performed at [facility]   Culture     Final   Value:  BLOOD CULTURE RECEIVED NO GROWTH TO DATE CULTURE WILL BE HELD FOR 5 DAYS BEFORE ISSUING A FINAL NEGATIVE REPORT     Performed at [facility]   Report Status PENDING   Incomplete  CULTURE, BLOOD (ROUTINE X 2)     Status: None   Collection Time    10/04/12  1:00 PM      Result Value Range Status   Specimen Description BLOOD RIGHT ARM   Final   Special Requests BOTTLES DRAWN AEROBIC ONLY 2CC   Final   Culture  Setup Time     Final   Value: 10/04/2012 19:06     Performed at [facility]   Culture     Final   Value:        BLOOD CULTURE RECEIVED NO GROWTH TO DATE  CULTURE WILL BE HELD FOR 5 DAYS BEFORE ISSUING A FINAL NEGATIVE REPORT     Performed at [facility]   Report Status PENDING   Incomplete  CULTURE, BLOOD (ROUTINE X 2)     Status: None   Collection Time    10/04/12  1:15 PM      Result Value Range Status   Specimen Description BLOOD LEFT HAND   Final   Special Requests BOTTLES DRAWN AEROBIC ONLY 2CC   Final   Culture  Setup Time     Final   Value: 10/04/2012 19:07     Performed at [facility]   Culture     Final   Value:        BLOOD CULTURE RECEIVED NO GROWTH TO DATE CULTURE WILL BE HELD FOR 5 DAYS BEFORE ISSUING A FINAL NEGATIVE REPORT     Performed at [facility]   Report Status PENDING   Incomplete  URINE CULTURE     Status: None   Collection Time    10/04/12  5:10 PM      Result Value Range Status   Specimen Description URINE, RANDOM   Final   Special Requests NONE   Final   Culture  Setup Time     Final   Value: 10/04/2012 23:26     Performed at [facility] Count     Final   Value: 20,OOO COLONIES/ML     Performed at [facility]   Culture     Final   Value: Multiple bacterial morphotypes present, none predominant. Suggest appropriate recollection if clinically indicated.     Performed at [facility]   Report Status 10/05/2012 FINAL   Final     Labs: Basic Metabolic Panel:  Recent Labs Lab 10/02/12 2045 10/04/12 0531 10/05/12 0520 10/06/12 0505  NA 129* 132* 138 139  K 3.4* 3.5 3.7 3.5  CL 92* 101 105 107  CO2 22 22 22 22   GLUCOSE 121* 93 102* 100*  BUN 14 16 11 8   CREATININE 0.93 1.11* 0.78 0.70  CALCIUM 9.3 7.8* 8.3* 8.4   Liver Function Tests:  Recent Labs Lab 10/02/12 2045  AST 33  ALT 28  ALKPHOS 79  BILITOT 0.3  PROT 7.0  ALBUMIN 3.7   No results found for this basename: LIPASE, AMYLASE,  in the last 168 hours No results found for this basename: AMMONIA,  in the last 168 hours CBC:  Recent Labs Lab 10/02/12 2045   10/03/12 0705 10/04/12 0531 10/04/12 2215 10/05/12 0520 10/06/12 0505  WBC 11.1*  --  10.0 7.5  --  5.5 4.6  NEUTROABS 7.9*  --  8.0*  --   --   --   --   HGB 6.9*  < > 8.0* 6.8* 8.9* 8.7* 8.7*  HCT 20.6*  < > 23.5* 20.9* 26.6* 26.1* 26.4*  MCV 91.6  --  88.3 89.3  --  88.8 89.5  PLT 343  --  295 239  --  275 262  < > = values in this interval not displayed. Cardiac Enzymes: No results found for this basename: CKTOTAL, CKMB, CKMBINDEX, TROPONINI,  in the last 168 hours BNP: BNP (last 3 results) No results found for this basename: PROBNP,  in the last 8760 hours CBG: No results found for this basename: GLUCAP,  in the last 168 hours     Signed:  Kela Millin  [facility] 10/06/2012, 2:11 PM

## 2012-10-06 NOTE — Progress Notes (Signed)
Discharge instructions given to pt/spouse, verbalized understanding. Left the unit in stable condition.  

## 2012-10-07 LAB — CULTURE, BLOOD (ROUTINE X 2)

## 2012-10-09 LAB — CULTURE, BLOOD (ROUTINE X 2): Culture: NO GROWTH

## 2012-10-10 LAB — CULTURE, BLOOD (ROUTINE X 2): Culture: NO GROWTH

## 2012-10-15 ENCOUNTER — Encounter (HOSPITAL_COMMUNITY): Payer: Self-pay | Admitting: Emergency Medicine

## 2012-10-15 ENCOUNTER — Emergency Department (HOSPITAL_COMMUNITY)
Admission: EM | Admit: 2012-10-15 | Discharge: 2012-10-15 | Disposition: A | Discharge status: Home / Self Care | Payer: Medicare Other | Attending: Emergency Medicine | Admitting: Emergency Medicine

## 2012-10-15 ENCOUNTER — Emergency Department (HOSPITAL_COMMUNITY): Payer: Medicare Other

## 2012-10-15 DIAGNOSIS — S8010XA Contusion of unspecified lower leg, initial encounter: Secondary | ICD-10-CM | POA: Insufficient documentation

## 2012-10-15 DIAGNOSIS — R0602 Shortness of breath: Secondary | ICD-10-CM | POA: Insufficient documentation

## 2012-10-15 DIAGNOSIS — Z7982 Long term (current) use of aspirin: Secondary | ICD-10-CM | POA: Insufficient documentation

## 2012-10-15 DIAGNOSIS — S5010XA Contusion of unspecified forearm, initial encounter: Secondary | ICD-10-CM | POA: Insufficient documentation

## 2012-10-15 DIAGNOSIS — Z79899 Other long term (current) drug therapy: Secondary | ICD-10-CM | POA: Insufficient documentation

## 2012-10-15 DIAGNOSIS — N390 Urinary tract infection, site not specified: Secondary | ICD-10-CM

## 2012-10-15 DIAGNOSIS — I10 Essential (primary) hypertension: Secondary | ICD-10-CM | POA: Insufficient documentation

## 2012-10-15 DIAGNOSIS — W1809XA Striking against other object with subsequent fall, initial encounter: Secondary | ICD-10-CM | POA: Insufficient documentation

## 2012-10-15 DIAGNOSIS — Y939 Activity, unspecified: Secondary | ICD-10-CM | POA: Insufficient documentation

## 2012-10-15 DIAGNOSIS — Z8739 Personal history of other diseases of the musculoskeletal system and connective tissue: Secondary | ICD-10-CM | POA: Insufficient documentation

## 2012-10-15 DIAGNOSIS — Y9289 Other specified places as the place of occurrence of the external cause: Secondary | ICD-10-CM | POA: Insufficient documentation

## 2012-10-15 LAB — URINALYSIS, ROUTINE W REFLEX MICROSCOPIC
Bilirubin Urine: NEGATIVE
Hgb urine dipstick: NEGATIVE
Nitrite: NEGATIVE
Specific Gravity, Urine: 1.032 — ABNORMAL HIGH (ref 1.005–1.030)
Urobilinogen, UA: 0.2 mg/dL (ref 0.0–1.0)
pH: 5 (ref 5.0–8.0)

## 2012-10-15 LAB — URINE MICROSCOPIC-ADD ON

## 2012-10-15 LAB — CBC WITH DIFFERENTIAL/PLATELET
Basophils Relative: 0 % (ref 0–1)
Hemoglobin: 10.8 g/dL — ABNORMAL LOW (ref 12.0–15.0)
Lymphocytes Relative: 29 % (ref 12–46)
Lymphs Abs: 2.1 10*3/uL (ref 0.7–4.0)
MCV: 87.3 fL (ref 78.0–100.0)
Monocytes Relative: 8 % (ref 3–12)
Neutro Abs: 4.7 10*3/uL (ref 1.7–7.7)
Neutrophils Relative %: 63 % (ref 43–77)
Platelets: 481 10*3/uL — ABNORMAL HIGH (ref 150–400)
RBC: 3.77 MIL/uL — ABNORMAL LOW (ref 3.87–5.11)
RDW: 17.5 % — ABNORMAL HIGH (ref 11.5–15.5)
WBC: 7.4 10*3/uL (ref 4.0–10.5)

## 2012-10-15 LAB — BASIC METABOLIC PANEL
BUN: 17 mg/dL (ref 6–23)
Chloride: 104 mEq/L (ref 96–112)
GFR calc Af Amer: >90 mL/min (ref >90)
GFR calc non Af Amer: 88 mL/min — ABNORMAL LOW (ref >90)
Glucose, Bld: 106 mg/dL — ABNORMAL HIGH (ref 70–99)
Potassium: 3.1 mEq/L — ABNORMAL LOW (ref 3.5–5.1)
Sodium: 138 mEq/L (ref 135–145)

## 2012-10-15 MED ORDER — ONDANSETRON 8 MG PO TBDP
8.0000 mg | ORAL_TABLET | Freq: Once | ORAL | Status: AC
  Administered 2012-10-15: 8 mg via ORAL
  Filled 2012-10-15: qty 1

## 2012-10-15 MED ORDER — CEPHALEXIN 500 MG PO CAPS: 500.0000 mg | ORAL_CAPSULE | Freq: Four times a day (QID) | ORAL | Status: DC

## 2012-10-15 MED ORDER — OXYCODONE-ACETAMINOPHEN 5-325 MG PO TABS
2.0000 | ORAL_TABLET | Freq: Once | ORAL | Status: AC
  Administered 2012-10-15: 2 via ORAL
  Filled 2012-10-15: qty 2

## 2012-10-15 MED ORDER — POTASSIUM CHLORIDE CRYS ER 20 MEQ PO TBCR
40.0000 meq | EXTENDED_RELEASE_TABLET | Freq: Once | ORAL | Status: AC
  Administered 2012-10-15: 40 meq via ORAL
  Filled 2012-10-15 (×2): qty 2

## 2012-10-15 NOTE — ED Notes (Signed)
Pt sent here from Dr.Reed d/t "peeing a lot", pt states fell last pm hitting the tub, small bruise over lt eye, no loc, no lac; pt states unsure why she is here.

## 2012-10-15 NOTE — ED Provider Notes (Signed)
CSN: [id number]     Arrival date & time 10/15/12  1627 History   First MD Initiated Contact with Patient 10/15/12 1645     Chief Complaint  Patient presents with  . Fall  . Back Pain   (Consider location/radiation/quality/duration/timing/severity/associated sxs/prior Treatment) HPI Comments: Patient is a 72 year old female with history of hypertension, spinal stenosis of lumbar region, fibromyalgia, and memory loss who presents today after being sent in by her PCP [name] for frequent urination. She reports she is unsure why she is here and wants to go home. She has no new pain. She has been having frequent falls and lives alone at home. She reports she has "a very nice neighbor that helps her out". Her family is in the room and confirms that she is at her mental baseline. She appears short of breath, but when questioned about it she states she is always short of breath and her breathing is at her baseline. Her family notes this has gradually worsened over the past few weeks. She was discharged from the hospital on 10/6 for anemia. Since that time she has had a decreased appetite. She reports she is very hungry right now. She reports that she does have urinary frequency, but no dysuria or urinary urgency. Patient denies any fevers, chills. She has chronic back pain which is unchanged.   The history is provided by the patient. No language interpreter was used.    Past Medical History  Diagnosis Date  . HTN (hypertension)   . Spinal stenosis of lumbar region   . Fibromyalgia   . Memory loss    Past Surgical History  Procedure Laterality Date  . Lumbar spine surgery  L3-L5 PLIF  . Cervical spine surgery  C4-C7 ACDF  . Esophagogastroduodenoscopy N/A 10/03/2012    Procedure: ESOPHAGOGASTRODUODENOSCOPY (EGD);  Surgeon: Florencia Reasons, MD;  Location: [facility];  Service: Endoscopy;  Laterality: N/A;   Family History  Problem Relation Age of Onset  . Coronary artery disease Brother   .  Hyperlipidemia Brother   . Heart disease Mother   . Heart disease Father   . Aneurysm Father   . Hyperlipidemia Father    History  Substance Use Topics  . Smoking status: Never Smoker   . Smokeless tobacco: Never Used  . Alcohol Use: No   OB History   Grav Para Term Preterm Abortions TAB SAB Ect Mult Living                 Review of Systems  Constitutional: Negative for fever and chills.  Respiratory: Positive for shortness of breath.   Cardiovascular: Negative for chest pain.  Gastrointestinal: Negative for abdominal pain.  Genitourinary: Positive for frequency.  Musculoskeletal: Positive for back pain (unchanged).       Frequent falls  All other systems reviewed and are negative.    Allergies  Review of patient's allergies indicates no known allergies.  Home Medications   Current Outpatient Rx  Name  Route  Sig  Dispense  Refill  . acetaminophen (TYLENOL) 500 MG tablet   Oral   Take 500 mg by mouth every 6 (six) hours as needed for pain.         Marland Kitchen ALPRAZolam (XANAX) 0.25 MG tablet   Oral   Take 0.25 mg by mouth 2 (two) times daily as needed. For anxiety.         . Ascorbic Acid (VITAMIN C) 100 MG tablet   Oral   Take 100  mg by mouth daily.         Marland Kitchen aspirin EC 81 MG tablet   Oral   Take 1 tablet (81 mg total) by mouth daily.   30 tablet   30     Beginning in 1week, on 10/13   . cyclobenzaprine (FLEXERIL) 10 MG tablet   Oral   Take 10 mg by mouth at bedtime.         . donepezil (ARICEPT) 10 MG tablet   Oral   Take 1 tablet (10 mg total) by mouth at bedtime.         Marland Kitchen doxylamine, Sleep, (UNISOM) 25 MG tablet   Oral   Take 50 mg by mouth at bedtime as needed for sleep.         . DULoxetine (CYMBALTA) 60 MG capsule   Oral   Take 60 mg by mouth every evening.         . fish oil-omega-3 fatty acids 1000 MG capsule   Oral   Take 2 g by mouth daily.         . furosemide (LASIX) 20 MG tablet   Oral   Take 20 mg by mouth daily.          Marland Kitchen gabapentin (NEURONTIN) 800 MG tablet   Oral   Take 800 mg by mouth 3 (three) times daily.         Marland Kitchen glycopyrrolate (ROBINUL) 1 MG tablet   Oral   Take 2 mg by mouth 2 (two) times daily.         Marland Kitchen lisinopril (PRINIVIL,ZESTRIL) 20 MG tablet   Oral   Take 20 mg by mouth every morning.         . metoprolol succinate (TOPROL-XL) 50 MG 24 hr tablet   Oral   Take 50 mg by mouth every morning. Take with or immediately following a meal.         . Multiple Vitamin (MULTIVITAMIN WITH MINERALS) TABS tablet   Oral   Take 1 tablet by mouth daily.         . pantoprazole (PROTONIX) 40 MG tablet      Protonix 40 mg by mouth twice a day for 3 months and then changed to one tablet daily thereafter.   60 tablet   0   . pravastatin (PRAVACHOL) 40 MG tablet   Oral   Take 40 mg by mouth at bedtime.         . simethicone (MYLICON) 125 MG chewable tablet   Oral   Chew 125 mg by mouth every 6 (six) hours as needed for flatulence.         . sucralfate (CARAFATE) 1 GM/10ML suspension   Oral   Take 10 mLs (1 g total) by mouth 4 (four) times daily -  with meals and at bedtime.   420 mL   0   . terbinafine (LAMISIL) 1 % cream   Topical   Apply 1 application topically 2 (two) times daily.         Marland Kitchen terbinafine (LAMISIL) 250 MG tablet   Oral   Take 250 mg by mouth daily.         . traMADol (ULTRAM) 50 MG tablet   Oral   Take 1 tablet (50 mg total) by mouth every 8 (eight) hours as needed for pain. For pain.   30 tablet   0    BP 157/126  Pulse 98  Temp(Src) 98.9 F (37.2 C) (Oral)  Resp 18  SpO2 100% Physical Exam  Nursing note and vitals reviewed. Constitutional: She is oriented to person, place, and time. She appears well-developed and well-nourished. No distress.  HENT:  Head: Normocephalic and atraumatic.  Right Ear: External ear normal.  Left Ear: External ear normal.  Nose: Nose normal.  Mouth/Throat: Oropharynx is clear and moist.  Eyes:  Conjunctivae and EOM are normal. Pupils are equal, round, and reactive to light.  Neck: Normal range of motion.  Cardiovascular: Normal rate, regular rhythm and normal heart sounds.   Pulmonary/Chest: Effort normal and breath sounds normal. No stridor. No respiratory distress. She has no wheezes. She has no rales.  Abdominal: Soft. She exhibits no distension. There is no tenderness. There is no rigidity, no rebound, no guarding and no CVA tenderness.  Musculoskeletal: Normal range of motion.  Neurological: She is alert and oriented to person, place, and time. She has normal strength.  Unable to perform finger nose finger likely due to confusion. She is not neglecting either finger.   Skin: Skin is warm and dry. Bruising noted. She is not diaphoretic. No erythema.  Contusion over right lower shin, bilaterally over right lower arms  Psychiatric: She has a normal mood and affect. Her behavior is normal.    ED Course  Procedures (including critical care time) Labs Review Labs Reviewed  CBC WITH DIFFERENTIAL - Abnormal; Notable for the following:    RBC 3.77 (*)    Hemoglobin 10.8 (*)    HCT 32.9 (*)    RDW 17.5 (*)    Platelets 481 (*)    All other components within normal limits  BASIC METABOLIC PANEL - Abnormal; Notable for the following:    Potassium 3.1 (*)    CO2 18 (*)    Glucose, Bld 106 (*)    GFR calc non Af Amer 88 (*)    All other components within normal limits  URINALYSIS, ROUTINE W REFLEX MICROSCOPIC - Abnormal; Notable for the following:    APPearance CLOUDY (*)    Specific Gravity, Urine 1.032 (*)    Leukocytes, UA MODERATE (*)    All other components within normal limits  URINE MICROSCOPIC-ADD ON - Abnormal; Notable for the following:    Bacteria, UA MANY (*)    All other components within normal limits  URINE CULTURE   Imaging Review Dg Chest 2 View  10/15/2012   CLINICAL DATA:  Shortness of breath, history hypertension  EXAM: CHEST  2 VIEW  COMPARISON:   10/02/2012  FINDINGS: Normal heart size and pulmonary vascularity.  Tortuous aorta.  Lungs clear.  No pleural effusion or pneumothorax.  Prior cervical spine fusion.  Scattered endplate spur formation thoracic spine.  IMPRESSION: No acute abnormalities.   Electronically Signed   By: Ulyses Southward M.D.   On: 10/15/2012 18:26    EKG Interpretation   None       MDM   1. UTI (lower urinary tract infection)    Pt presents to ED sent in by PCP for frequent urination. Pt also having frequent falls. Neuro intact. At mental baseline. She denies headache or hitting head in fall. CT is not indicated at this time. Pt refused in and out cath and had difficulty obtaining a urine. Pt unhappy her wait time was so long as she wanted to go home. Ultimately pt has been diagnosed with a UTI. Pt is afebrile, no CVA tenderness, normotensive, and denies N/V. Pt to be dc home with antibiotics and instructions to follow up  with PCP if symptoms persist. Return instructions given. Vital signs stable for discharge. Dr. Wilkie Aye evaluated patient and agrees with plan.      Mora Bellman, PA-C 10/16/12 [phone]

## 2012-10-16 NOTE — ED Provider Notes (Signed)
Medical screening examination/treatment/procedure(s) were conducted as a shared visit with non-physician practitioner(s) and myself.  I personally evaluated the patient during the encounter  Patient presents with recurrent falls and urinary incontinence.  Nontoxic.  Multiple abrasions and ecchymosis on bilateral forearms.  NO evidence of head injury.  Referred by PCP.  Work-up notable for acute UTI. Patient to be treated as an outpatient and will follow-up with PCP.  After history, exam, and medical workup I feel the patient has been appropriately medically screened and is safe for discharge home. Pertinent diagnoses were discussed with the patient. Patient was given return precautions.   Shon Baton, MD 10/16/12 [phone]

## 2012-10-17 LAB — URINE CULTURE

## 2012-12-04 ENCOUNTER — Encounter (HOSPITAL_COMMUNITY): Payer: Self-pay | Admitting: Emergency Medicine

## 2012-12-04 ENCOUNTER — Emergency Department (HOSPITAL_COMMUNITY)
Admission: EM | Admit: 2012-12-04 | Discharge: 2012-12-04 | Disposition: A | Discharge status: Home / Self Care | Payer: Medicare Other | Attending: Emergency Medicine | Admitting: Emergency Medicine

## 2012-12-04 ENCOUNTER — Emergency Department (HOSPITAL_COMMUNITY): Payer: Medicare Other

## 2012-12-04 DIAGNOSIS — Z79899 Other long term (current) drug therapy: Secondary | ICD-10-CM | POA: Insufficient documentation

## 2012-12-04 DIAGNOSIS — I10 Essential (primary) hypertension: Secondary | ICD-10-CM | POA: Insufficient documentation

## 2012-12-04 DIAGNOSIS — R079 Chest pain, unspecified: Secondary | ICD-10-CM | POA: Insufficient documentation

## 2012-12-04 DIAGNOSIS — Z8739 Personal history of other diseases of the musculoskeletal system and connective tissue: Secondary | ICD-10-CM | POA: Insufficient documentation

## 2012-12-04 LAB — URINALYSIS, ROUTINE W REFLEX MICROSCOPIC
Glucose, UA: NEGATIVE mg/dL
Hgb urine dipstick: NEGATIVE
Ketones, ur: NEGATIVE mg/dL
Nitrite: NEGATIVE
Protein, ur: NEGATIVE mg/dL
Urobilinogen, UA: 0.2 mg/dL (ref 0.0–1.0)

## 2012-12-04 LAB — COMPREHENSIVE METABOLIC PANEL
AST: 24 U/L (ref 0–37)
Alkaline Phosphatase: 75 U/L (ref 39–117)
BUN: 15 mg/dL (ref 6–23)
CO2: 26 mEq/L (ref 19–32)
Calcium: 8.9 mg/dL (ref 8.4–10.5)
Chloride: 102 mEq/L (ref 96–112)
Creatinine, Ser: 0.92 mg/dL (ref 0.50–1.10)
GFR calc Af Amer: 70 mL/min — ABNORMAL LOW (ref >90)
GFR calc non Af Amer: 61 mL/min — ABNORMAL LOW (ref >90)
Glucose, Bld: 102 mg/dL — ABNORMAL HIGH (ref 70–99)
Potassium: 4.1 mEq/L (ref 3.5–5.1)
Total Bilirubin: 0.2 mg/dL — ABNORMAL LOW (ref 0.3–1.2)

## 2012-12-04 LAB — CBC
HCT: 34.7 % — ABNORMAL LOW (ref 36.0–46.0)
Hemoglobin: 11 g/dL — ABNORMAL LOW (ref 12.0–15.0)
MCH: 29.2 pg (ref 26.0–34.0)
MCHC: 31.7 g/dL (ref 30.0–36.0)
MCV: 92 fL (ref 78.0–100.0)
WBC: 6.3 10*3/uL (ref 4.0–10.5)

## 2012-12-04 LAB — URINE MICROSCOPIC-ADD ON

## 2012-12-04 LAB — POCT I-STAT TROPONIN I: Troponin i, poc: 0.01 ng/mL (ref 0.00–0.08)

## 2012-12-04 MED ORDER — ASPIRIN 81 MG PO CHEW: 324.0000 mg | CHEWABLE_TABLET | Freq: Once | ORAL | Status: DC

## 2012-12-04 MED ORDER — SODIUM CHLORIDE 0.9 % IV SOLN: 20.0000 mL | INTRAVENOUS | Status: DC

## 2012-12-04 NOTE — ED Notes (Signed)
Pt from home, c/o cp starting at 1230. Pt states substernal chest heaviness. Pt received 324 asa and 3 nitro w/ no relief. NSD, VS stable.

## 2012-12-04 NOTE — ED Provider Notes (Signed)
CSN: [id number]     Arrival date & time 12/04/12  1312 History   First MD Initiated Contact with Patient 12/04/12 1312     Chief Complaint  Patient presents with  . Chest Pain   (Consider location/radiation/quality/duration/timing/severity/associated sxs/prior Treatment) HPI Comments: Sarah [name] is a 72 y.o. female who presents for evaluation of chest pain. She is a poor historian and unable to relate the details of the incident, today. She recalls talking on the phone, then developing chest pain, after that, her husband called an ambulance. She was transferred here by EMS, and during that transport received 3 sublingual nitroglycerin and one full-strength aspirin. Reportedly, her pain improved, after that. At the time of evaluation she is comfortable and stating that she has only mild chest discomfort. She is unable to quantitate the pain or describe it any further.   Level V caveat: Poor historian   Patient is a 72 y.o. female presenting with chest pain. The history is provided by the patient.  Chest Pain   Past Medical History  Diagnosis Date  . HTN (hypertension)   . Spinal stenosis of lumbar region   . Fibromyalgia   . Memory loss    Past Surgical History  Procedure Laterality Date  . Lumbar spine surgery  L3-L5 PLIF  . Cervical spine surgery  C4-C7 ACDF  . Esophagogastroduodenoscopy N/A 10/03/2012    Procedure: ESOPHAGOGASTRODUODENOSCOPY (EGD);  Surgeon: Florencia Reasons, MD;  Location: [facility];  Service: Endoscopy;  Laterality: N/A;   Family History  Problem Relation Age of Onset  . Coronary artery disease Brother   . Hyperlipidemia Brother   . Heart disease Mother   . Heart disease Father   . Aneurysm Father   . Hyperlipidemia Father    History  Substance Use Topics  . Smoking status: Never Smoker   . Smokeless tobacco: Never Used  . Alcohol Use: No   OB History   Grav Para Term Preterm Abortions TAB SAB Ect Mult Living                 Review of  Systems  Unable to perform ROS Cardiovascular: Positive for chest pain.    Allergies  Review of patient's allergies indicates no known allergies.  Home Medications   Current Outpatient Rx  Name  Route  Sig  Dispense  Refill  . acetaminophen (TYLENOL) 500 MG tablet   Oral   Take 1,000 mg by mouth every 6 (six) hours as needed for mild pain.          Marland Kitchen ALPRAZolam (XANAX) 0.25 MG tablet   Oral   Take 0.25 mg by mouth 2 (two) times daily as needed for anxiety. For anxiety.         . Ascorbic Acid (VITAMIN C) 100 MG tablet   Oral   Take 100 mg by mouth daily.         Marland Kitchen CALCIUM PO   Oral   Take 1 tablet by mouth daily.         . cyclobenzaprine (FLEXERIL) 10 MG tablet   Oral   Take 10 mg by mouth daily as needed for muscle spasms.          Marland Kitchen donepezil (ARICEPT) 10 MG tablet   Oral   Take 1 tablet (10 mg total) by mouth at bedtime.         . DULoxetine (CYMBALTA) 60 MG capsule   Oral   Take 60 mg by mouth every evening.         Marland Kitchen  fish oil-omega-3 fatty acids 1000 MG capsule   Oral   Take 2 g by mouth daily.         . furosemide (LASIX) 20 MG tablet   Oral   Take 20 mg by mouth daily.         Marland Kitchen gabapentin (NEURONTIN) 800 MG tablet   Oral   Take 800 mg by mouth 3 (three) times daily.         Marland Kitchen glycopyrrolate (ROBINUL) 1 MG tablet   Oral   Take 2 mg by mouth daily.          Marland Kitchen lisinopril (PRINIVIL,ZESTRIL) 20 MG tablet   Oral   Take 20 mg by mouth daily.          . metoprolol succinate (TOPROL-XL) 50 MG 24 hr tablet   Oral   Take 50 mg by mouth daily. Take with or immediately following a meal.         . Multiple Vitamin (MULTIVITAMIN WITH MINERALS) TABS tablet   Oral   Take 1 tablet by mouth daily.         . pravastatin (PRAVACHOL) 40 MG tablet   Oral   Take 40 mg by mouth at bedtime.         . terbinafine (LAMISIL) 1 % cream   Topical   Apply 1 application topically daily as needed.           BP 110/87  Pulse 73   Temp(Src) 97.9 F (36.6 C) (Oral)  Resp 19  SpO2 100% Physical Exam  Nursing note and vitals reviewed. Constitutional: She is oriented to person, place, and time. She appears well-developed.  Elderly, frail  HENT:  Head: Normocephalic and atraumatic.  Eyes: Conjunctivae and EOM are normal. Pupils are equal, round, and reactive to light.  Neck: Normal range of motion and phonation normal. Neck supple.  Cardiovascular: Normal rate, regular rhythm and intact distal pulses.   Pulmonary/Chest: Effort normal and breath sounds normal. She exhibits no tenderness.  Abdominal: Soft. She exhibits no distension. There is no tenderness. There is no guarding.  Musculoskeletal: Normal range of motion.  Neurological: She is alert and oriented to person, place, and time. She exhibits normal muscle tone.  Skin: Skin is warm and dry.  Psychiatric: She has a normal mood and affect. Her behavior is normal. Judgment and thought content normal.    ED Course  Procedures (including critical care time)  Medications  0.9 %  sodium chloride infusion (not administered)  aspirin chewable tablet 324 mg (324 mg Oral Not Given 12/04/12 1432)     Patient Vitals for the past 24 hrs:  BP Temp Temp src Pulse Resp SpO2  12/04/12 1630 110/87 mmHg - - 73 19 100 %  12/04/12 1538 129/91 mmHg 97.9 F (36.6 C) Oral 75 14 98 %  12/04/12 1530 129/91 mmHg - - 67 24 97 %  12/04/12 1430 106/61 mmHg - - 71 18 99 %  12/04/12 1428 108/59 mmHg 98 F (36.7 C) Oral 70 18 99 %  12/04/12 1305 114/65 mmHg 98.3 F (36.8 C) Oral 75 14 98 %   4:19 PM Reevaluation with update and discussion. After initial assessment and treatment, an updated evaluation reveals she states that her chest pain has resolved completely. Her husband, is now here. He was able to give additional history that the patient was talking on the phone to a nurse at her doctor's office, when she complained of chest pain. That nurse, called  the ambulance to bring her  here. Sarah [name]    Labs Review Labs Reviewed  CBC - Abnormal; Notable for the following:    RBC 3.77 (*)    Hemoglobin 11.0 (*)    HCT 34.7 (*)    RDW 16.1 (*)    All other components within normal limits  COMPREHENSIVE METABOLIC PANEL - Abnormal; Notable for the following:    Glucose, Bld 102 (*)    Albumin 3.3 (*)    Total Bilirubin 0.2 (*)    GFR calc non Af Amer 61 (*)    GFR calc Af Amer 70 (*)    All other components within normal limits  URINALYSIS, ROUTINE W REFLEX MICROSCOPIC - Abnormal; Notable for the following:    APPearance CLOUDY (*)    Leukocytes, UA SMALL (*)    All other components within normal limits  URINE MICROSCOPIC-ADD ON - Abnormal; Notable for the following:    Bacteria, UA MANY (*)    Casts HYALINE CASTS (*)    All other components within normal limits  URINE CULTURE  POCT I-STAT TROPONIN I   Imaging Review Dg Chest Portable 1 View  12/04/2012   CLINICAL DATA:  Epigastric pain  EXAM: PORTABLE CHEST - 1 VIEW  COMPARISON:  10/15/2012  FINDINGS: Heart size is normal. No pleural effusion or edema identified. No airspace consolidation identified.  IMPRESSION: 1. No acute cardiopulmonary abnormalities.   Electronically Signed   By: Signa Kell M.D.   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 Chest pain       Chest pain, atypical for coronary artery disease complications. I doubt ACS, PE, pneumonia, or metabolic instability. Patient has improved spontaneously, in stable for discharge with outpatient management. Incidental note is made of abnormal urinalysis urine culture has been  ordered  Nursing Notes Reviewed/ Care Coordinated, and agree without changes. Applicable Imaging Reviewed.  Interpretation of Laboratory Data incorporated into ED treatment   Plan: Home Medications- usual; Home Treatments and Observation- rest, watch for progressive symptoms; return here if the recommended treatment, does not improve the symptoms; Recommended follow up- PCP, one week for checkup    Flint Melter, MD 12/04/12 1715

## 2012-12-04 NOTE — ED Notes (Signed)
Dr Effie Shy at bedside to eval pt

## 2012-12-05 LAB — URINE CULTURE: Colony Count: >=100000

## 2012-12-06 NOTE — Progress Notes (Signed)
ED Antimicrobial Stewardship Positive Culture Follow Up   Sarah [name] is an 72 y.o. female who presented to [facility] on 12/04/2012 with a chief complaint of CP Chief Complaint  Patient presents with  . Chest Pain    Recent Results (from the past 720 hour(s))  URINE CULTURE     Status: None   Collection Time    12/04/12  3:10 PM      Result Value Range Status   Specimen Description URINE, CATHETERIZED   Final   Special Requests Normal   Final   Culture  Setup Time     Final   Value: 12/04/2012 17:00     Performed at [facility] Count     Final   Value: >=100,000 COLONIES/ML     Performed at [facility]   Culture     Final   Value: VIRIDANS STREPTOCOCCUS     Performed at [facility]   Report Status 12/05/2012 FINAL   Final    [x]  Additional follow-up recommended  72 y.o. 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 At that time the patient was noted to have fevers and an elevated WBC. 1/2 BCx grew peptostreptococcus -- which likely seeded from the oral cavity.   On 12/4, the patient presented to the [facility] with CP and was found to have a UTI which grew viridans strept. This pathogen can be found normally in the female genital tract, oral cavity, and GI tract however is also a known pathogen for native valve endocarditis. Given the patient's history of erosive esophagitis, recent blood cultures positive for an oral pathogen, and presentation with CP -- would recommend the patient get additional blood cultures to rule out infective endocarditis. The patient was asymptomatic of a UTI  -- so treatment for this alone is not recommended  Recommendation: * The patient should follow-up with her PCP to get additional blood cultures to r/o infective endocarditis * If the patient's PCP wants to treat the patient for the UTI -- can treat with oral Keflex 500 mg bid after blood  cultures have been obtained (will let the PCP decide on this as the patient was asymptomatic of a UTI in the ED)  ED Provider: Antony Madura, PA-C  Rolley Sims 12/06/2012, 9:29 AM Infectious Diseases Pharmacist Phone# [phone]

## 2012-12-07 ENCOUNTER — Telehealth (HOSPITAL_COMMUNITY): Payer: Self-pay | Admitting: Emergency Medicine

## 2012-12-07 NOTE — ED Notes (Signed)
Post ED Visit - Positive Culture Follow-up: Successful Patient Follow-Up  Culture assessed and recommendations reviewed by: []  Wes Dulaney, Pharm.D., BCPS []  Celedonio Miyamoto, Pharm.D., BCPS [x]  Georgina Pillion, Pharm.D., BCPS []  [place], [address].D., BCPS, AAHIVP []  Estella Husk, Pharm.D., BCPS, AAHIVP  Positive urine culture  [x]  Patient discharged without antimicrobial prescription and treatment is now indicated []  Organism is resistant to prescribed ED discharge antimicrobial []  Patient with positive blood cultures  Changes discussed with ED provider: Antony Madura PA-C Per Antony Madura PA-C, would recommend the patient get BCX at PCP. No treatment for UTI necessary.   Kylie A Holland 12/07/2012, 11:15 AM

## 2013-01-05 ENCOUNTER — Encounter (HOSPITAL_COMMUNITY): Payer: Self-pay | Admitting: Anesthesiology

## 2013-10-05 ENCOUNTER — Other Ambulatory Visit: Payer: Self-pay | Admitting: Family Medicine

## 2013-10-05 ENCOUNTER — Ambulatory Visit
Admission: RE | Admit: 2013-10-05 | Discharge: 2013-10-05 | Disposition: A | Discharge status: Home / Self Care | Payer: Medicare Other | Source: Ambulatory Visit | Attending: Family Medicine | Admitting: Family Medicine

## 2013-10-05 DIAGNOSIS — M545 Low back pain, unspecified: Secondary | ICD-10-CM

## 2013-10-16 ENCOUNTER — Other Ambulatory Visit: Payer: Self-pay | Admitting: Family Medicine

## 2013-10-16 DIAGNOSIS — M545 Low back pain: Secondary | ICD-10-CM

## 2013-10-22 ENCOUNTER — Ambulatory Visit
Admission: RE | Admit: 2013-10-22 | Discharge: 2013-10-22 | Disposition: A | Discharge status: Home / Self Care | Payer: Medicare Other | Source: Ambulatory Visit | Attending: Family Medicine | Admitting: Family Medicine

## 2013-10-22 DIAGNOSIS — M545 Low back pain: Secondary | ICD-10-CM

## 2015-02-08 DIAGNOSIS — M545 Low back pain: Secondary | ICD-10-CM | POA: Diagnosis not present

## 2015-02-08 DIAGNOSIS — R140 Abdominal distension (gaseous): Secondary | ICD-10-CM | POA: Diagnosis not present

## 2015-02-08 DIAGNOSIS — F4320 Adjustment disorder, unspecified: Secondary | ICD-10-CM | POA: Diagnosis not present

## 2015-06-23 DIAGNOSIS — N183 Chronic kidney disease, stage 3 (moderate): Secondary | ICD-10-CM | POA: Diagnosis not present

## 2015-06-23 DIAGNOSIS — F419 Anxiety disorder, unspecified: Secondary | ICD-10-CM | POA: Diagnosis not present

## 2015-06-23 DIAGNOSIS — F0390 Unspecified dementia without behavioral disturbance: Secondary | ICD-10-CM | POA: Diagnosis not present

## 2015-06-23 DIAGNOSIS — M545 Low back pain: Secondary | ICD-10-CM | POA: Diagnosis not present

## 2015-06-23 DIAGNOSIS — I129 Hypertensive chronic kidney disease with stage 1 through stage 4 chronic kidney disease, or unspecified chronic kidney disease: Secondary | ICD-10-CM | POA: Diagnosis not present

## 2015-06-23 DIAGNOSIS — E782 Mixed hyperlipidemia: Secondary | ICD-10-CM | POA: Diagnosis not present

## 2015-06-23 DIAGNOSIS — Z0000 Encounter for general adult medical examination without abnormal findings: Secondary | ICD-10-CM | POA: Diagnosis not present

## 2015-06-23 DIAGNOSIS — M797 Fibromyalgia: Secondary | ICD-10-CM | POA: Diagnosis not present

## 2015-09-29 DIAGNOSIS — I10 Essential (primary) hypertension: Secondary | ICD-10-CM | POA: Diagnosis not present

## 2015-09-29 DIAGNOSIS — G44209 Tension-type headache, unspecified, not intractable: Secondary | ICD-10-CM | POA: Diagnosis not present

## 2015-09-29 DIAGNOSIS — Z23 Encounter for immunization: Secondary | ICD-10-CM | POA: Diagnosis not present

## 2016-04-06 DIAGNOSIS — I10 Essential (primary) hypertension: Secondary | ICD-10-CM | POA: Diagnosis not present

## 2016-04-06 DIAGNOSIS — M545 Low back pain: Secondary | ICD-10-CM | POA: Diagnosis not present

## 2016-04-06 DIAGNOSIS — F0390 Unspecified dementia without behavioral disturbance: Secondary | ICD-10-CM | POA: Diagnosis not present

## 2016-04-06 DIAGNOSIS — E782 Mixed hyperlipidemia: Secondary | ICD-10-CM | POA: Diagnosis not present

## 2016-05-29 IMAGING — CR DG THORACOLUMBAR SPINE 2V
3 series · 3 of 3 positions shown · non-contrast
Comparison: None.

CLINICAL DATA: Chronic back pain with 2 previous surgical
procedures ; 1 year history of mid spine pain at the level of the
iliac crests which is worsening ; no no radicular symptoms ; initial
visit

EXAM:
THORACOLUMBAR SPINE - 2 VIEW

[t t-spine/l-spine junc. ap]
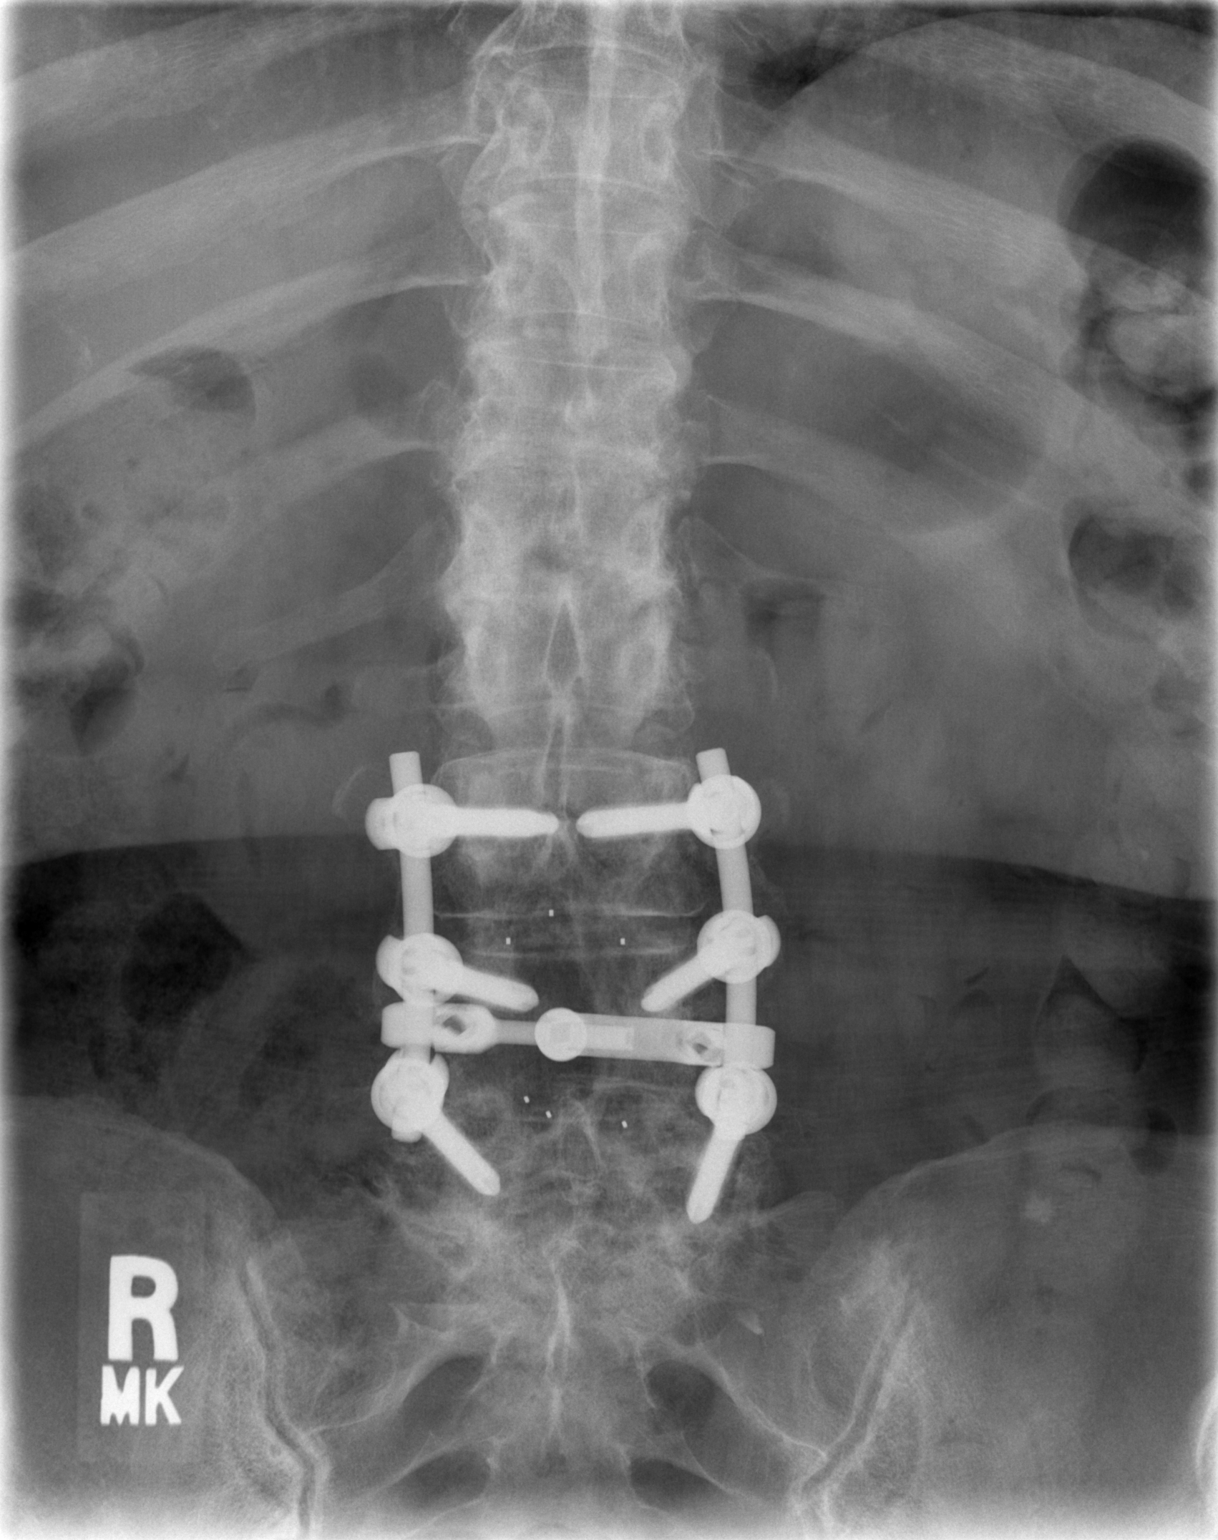

[t t-spine/l-spine junc lat (1 of 2)]
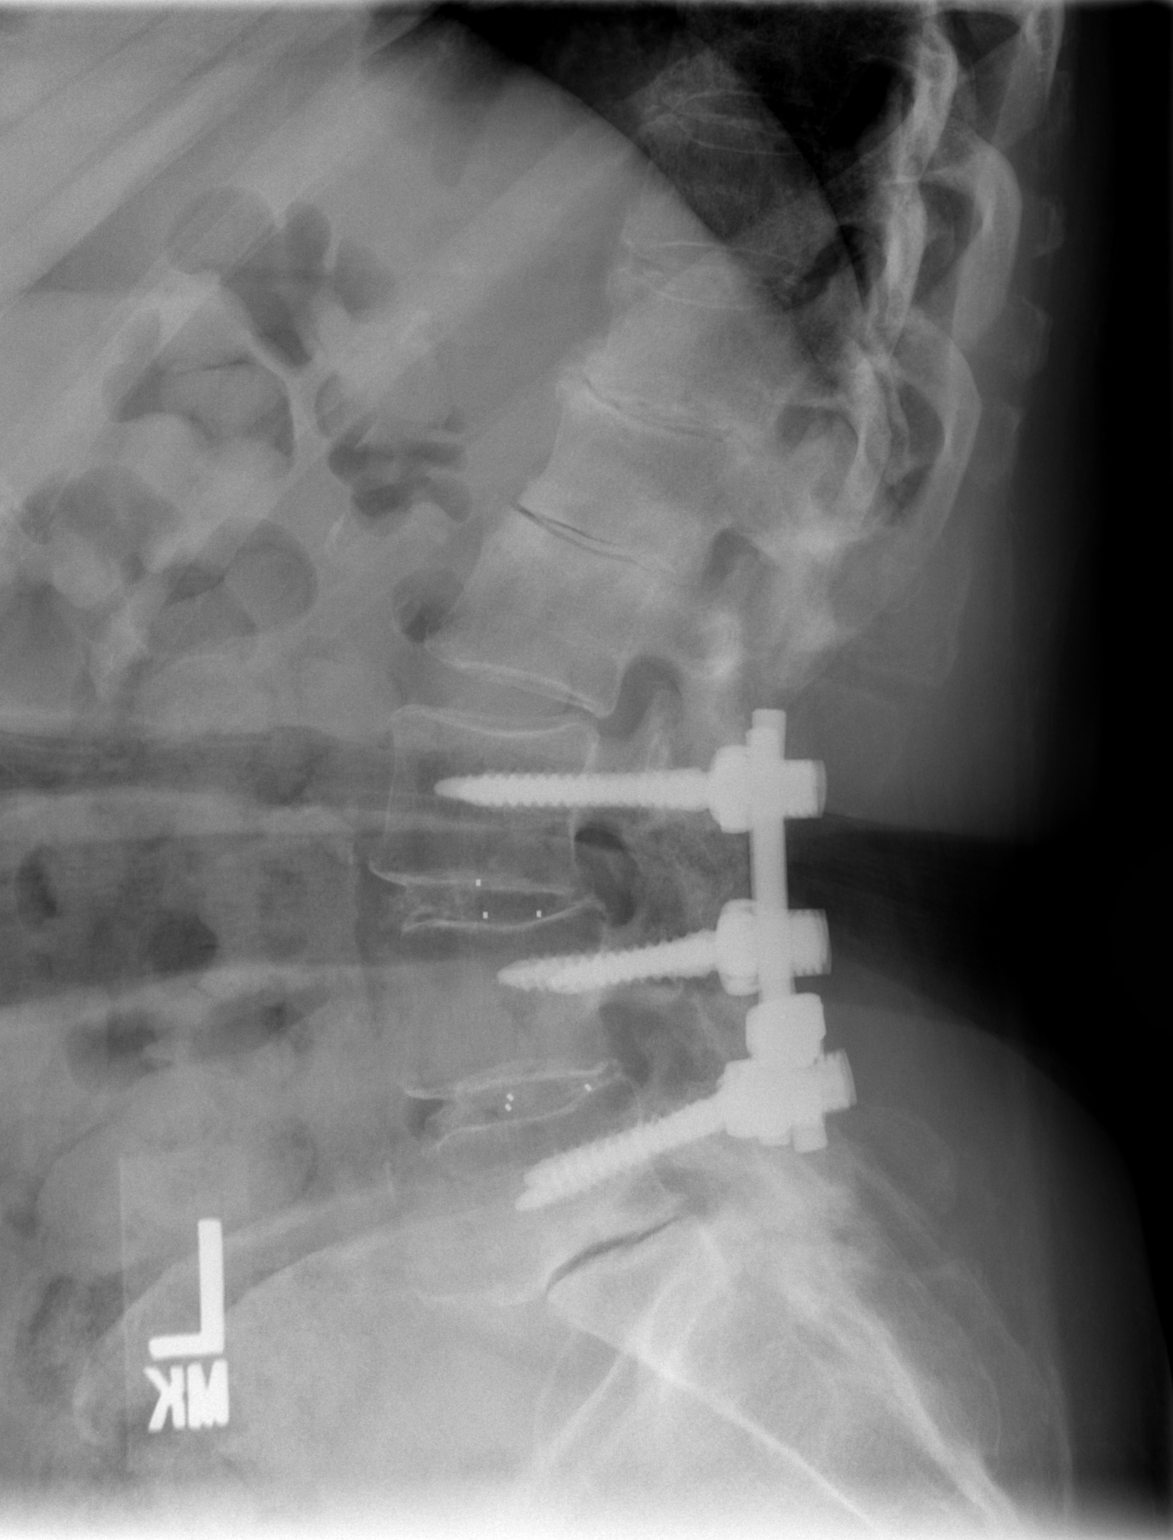

[t t-spine/l-spine junc lat (2 of 2)]
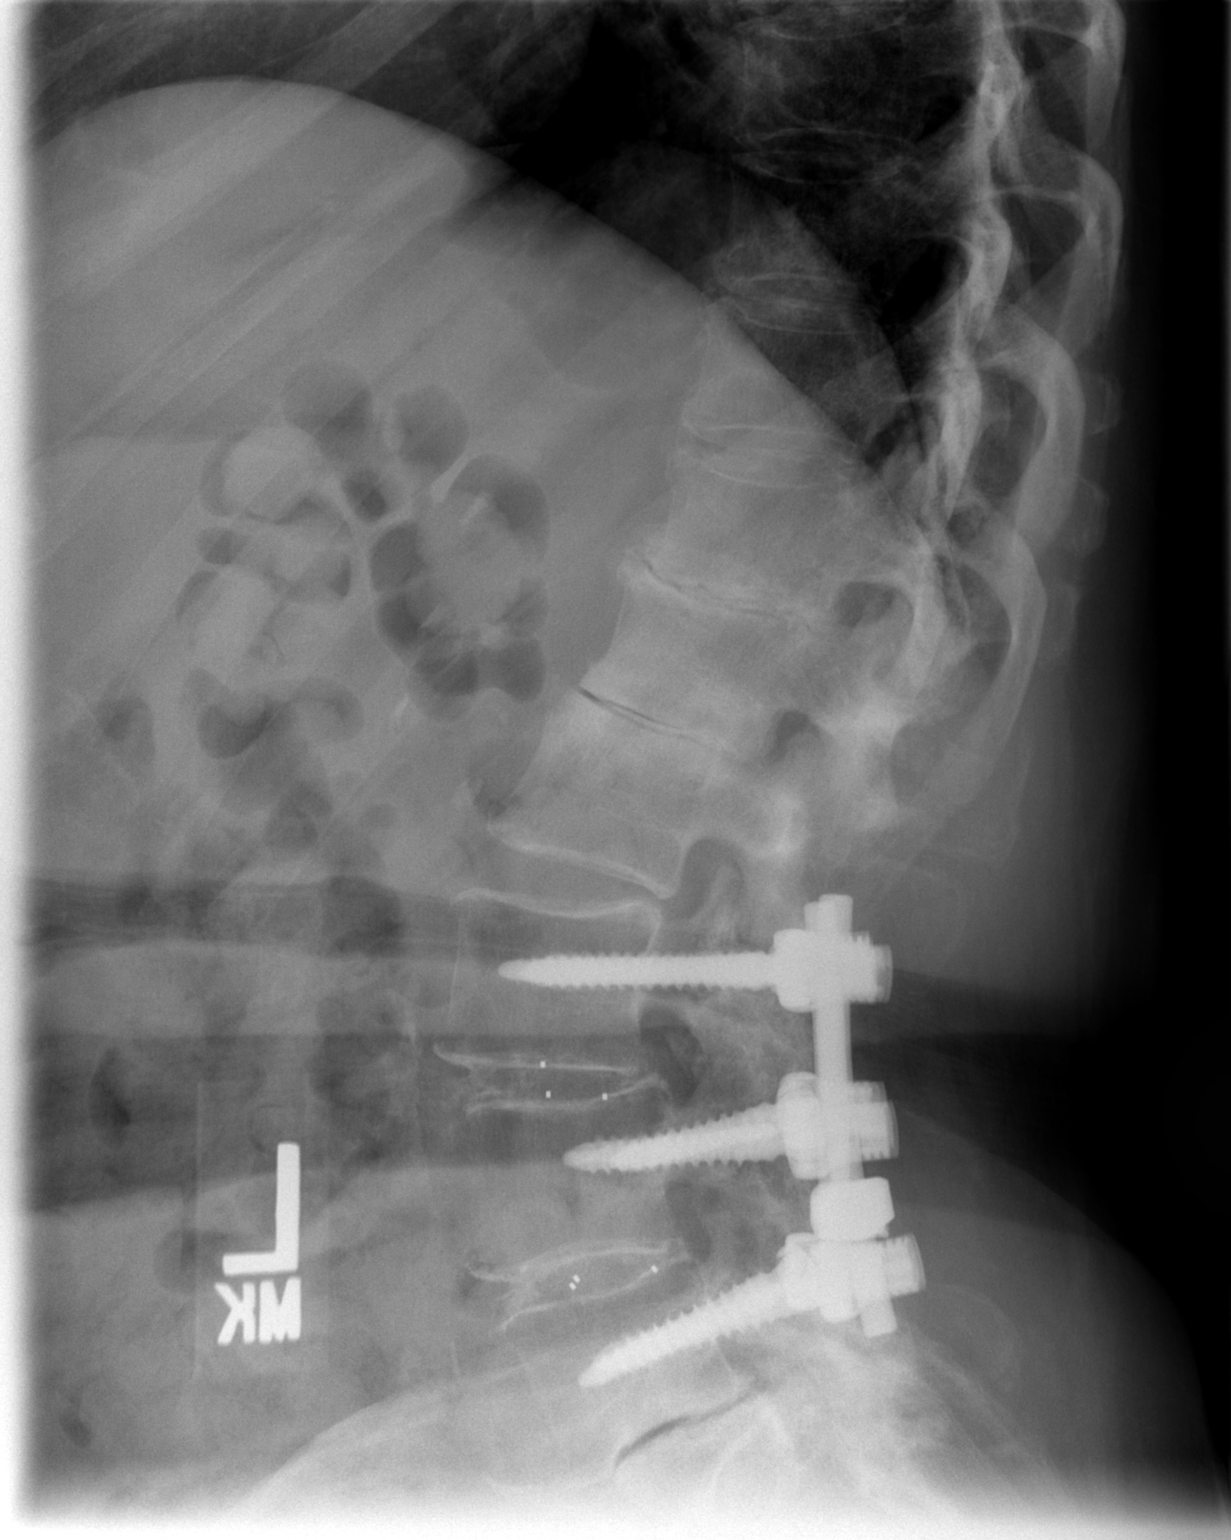

[3 of 3 positions shown; findings below may reference images not displayed]

FINDINGS: The lumbar vertebral bodies are preserved in height. The patient has
undergone posterior fusion and interdiscal device placement at L3-4
and L4-5. There is high-grade disc space narrowing at L5-S1 as well
as at T12-L1 and L1-L2. There is no significant spondylolisthesis.
The pedicles are intact where visualized. The observed portions of
the sacrum are unremarkable.
IMPRESSION: 1. There is no compression fracture. There is high-grade
degenerative disc space narrowing at T12-L1, L1-L2, and L4-L5.
2. The patient has undergone previous posterior fusion and
intradiscal device placement from L3 through L5 without evidence of
hardware failure.

## 2016-10-29 DIAGNOSIS — E782 Mixed hyperlipidemia: Secondary | ICD-10-CM | POA: Diagnosis not present

## 2016-10-29 DIAGNOSIS — M545 Low back pain: Secondary | ICD-10-CM | POA: Diagnosis not present

## 2016-10-29 DIAGNOSIS — Z0000 Encounter for general adult medical examination without abnormal findings: Secondary | ICD-10-CM | POA: Diagnosis not present

## 2016-10-29 DIAGNOSIS — I10 Essential (primary) hypertension: Secondary | ICD-10-CM | POA: Diagnosis not present

## 2017-04-10 DIAGNOSIS — I10 Essential (primary) hypertension: Secondary | ICD-10-CM | POA: Diagnosis not present

## 2017-04-10 DIAGNOSIS — E782 Mixed hyperlipidemia: Secondary | ICD-10-CM | POA: Diagnosis not present

## 2017-04-10 DIAGNOSIS — M545 Low back pain: Secondary | ICD-10-CM | POA: Diagnosis not present

## 2017-04-10 DIAGNOSIS — F0390 Unspecified dementia without behavioral disturbance: Secondary | ICD-10-CM | POA: Diagnosis not present

## 2017-06-11 DIAGNOSIS — E2839 Other primary ovarian failure: Secondary | ICD-10-CM | POA: Diagnosis not present

## 2017-06-11 DIAGNOSIS — M8588 Other specified disorders of bone density and structure, other site: Secondary | ICD-10-CM | POA: Diagnosis not present

## 2017-11-05 DIAGNOSIS — E782 Mixed hyperlipidemia: Secondary | ICD-10-CM | POA: Diagnosis not present

## 2017-11-05 DIAGNOSIS — I10 Essential (primary) hypertension: Secondary | ICD-10-CM | POA: Diagnosis not present

## 2017-11-05 DIAGNOSIS — M545 Low back pain: Secondary | ICD-10-CM | POA: Diagnosis not present

## 2017-11-05 DIAGNOSIS — F0390 Unspecified dementia without behavioral disturbance: Secondary | ICD-10-CM | POA: Diagnosis not present

## 2017-11-05 DIAGNOSIS — Z0000 Encounter for general adult medical examination without abnormal findings: Secondary | ICD-10-CM | POA: Diagnosis not present

## 2018-04-17 DIAGNOSIS — R21 Rash and other nonspecific skin eruption: Secondary | ICD-10-CM | POA: Diagnosis not present

## 2018-05-28 DIAGNOSIS — M545 Low back pain: Secondary | ICD-10-CM | POA: Diagnosis not present

## 2018-05-28 DIAGNOSIS — M8588 Other specified disorders of bone density and structure, other site: Secondary | ICD-10-CM | POA: Diagnosis not present

## 2018-05-28 DIAGNOSIS — I10 Essential (primary) hypertension: Secondary | ICD-10-CM | POA: Diagnosis not present

## 2018-05-28 DIAGNOSIS — E782 Mixed hyperlipidemia: Secondary | ICD-10-CM | POA: Diagnosis not present

## 2018-11-10 DIAGNOSIS — M545 Low back pain: Secondary | ICD-10-CM | POA: Diagnosis not present

## 2018-11-10 DIAGNOSIS — Z0000 Encounter for general adult medical examination without abnormal findings: Secondary | ICD-10-CM | POA: Diagnosis not present

## 2018-11-10 DIAGNOSIS — E782 Mixed hyperlipidemia: Secondary | ICD-10-CM | POA: Diagnosis not present

## 2018-11-10 DIAGNOSIS — I10 Essential (primary) hypertension: Secondary | ICD-10-CM | POA: Diagnosis not present

## 2018-11-26 DIAGNOSIS — I10 Essential (primary) hypertension: Secondary | ICD-10-CM | POA: Diagnosis not present

## 2018-11-26 DIAGNOSIS — E782 Mixed hyperlipidemia: Secondary | ICD-10-CM | POA: Diagnosis not present

## 2019-06-25 DIAGNOSIS — M8588 Other specified disorders of bone density and structure, other site: Secondary | ICD-10-CM | POA: Diagnosis not present

## 2019-06-25 DIAGNOSIS — I10 Essential (primary) hypertension: Secondary | ICD-10-CM | POA: Diagnosis not present

## 2019-06-25 DIAGNOSIS — E782 Mixed hyperlipidemia: Secondary | ICD-10-CM | POA: Diagnosis not present

## 2019-06-25 DIAGNOSIS — M545 Low back pain: Secondary | ICD-10-CM | POA: Diagnosis not present

## 2020-02-03 DIAGNOSIS — I10 Essential (primary) hypertension: Secondary | ICD-10-CM | POA: Diagnosis not present

## 2020-02-03 DIAGNOSIS — Z0000 Encounter for general adult medical examination without abnormal findings: Secondary | ICD-10-CM | POA: Diagnosis not present

## 2020-02-03 DIAGNOSIS — M8588 Other specified disorders of bone density and structure, other site: Secondary | ICD-10-CM | POA: Diagnosis not present

## 2020-02-03 DIAGNOSIS — E782 Mixed hyperlipidemia: Secondary | ICD-10-CM | POA: Diagnosis not present

## 2020-08-02 DIAGNOSIS — I10 Essential (primary) hypertension: Secondary | ICD-10-CM | POA: Diagnosis not present

## 2020-08-02 DIAGNOSIS — M545 Low back pain, unspecified: Secondary | ICD-10-CM | POA: Diagnosis not present

## 2020-08-02 DIAGNOSIS — M8588 Other specified disorders of bone density and structure, other site: Secondary | ICD-10-CM | POA: Diagnosis not present

## 2020-08-02 DIAGNOSIS — E782 Mixed hyperlipidemia: Secondary | ICD-10-CM | POA: Diagnosis not present

## 2021-02-08 DIAGNOSIS — Z0000 Encounter for general adult medical examination without abnormal findings: Secondary | ICD-10-CM | POA: Diagnosis not present

## 2021-02-08 DIAGNOSIS — I10 Essential (primary) hypertension: Secondary | ICD-10-CM | POA: Diagnosis not present

## 2021-02-08 DIAGNOSIS — E782 Mixed hyperlipidemia: Secondary | ICD-10-CM | POA: Diagnosis not present

## 2021-02-08 DIAGNOSIS — M545 Low back pain, unspecified: Secondary | ICD-10-CM | POA: Diagnosis not present

## 2021-02-24 DIAGNOSIS — Z23 Encounter for immunization: Secondary | ICD-10-CM | POA: Diagnosis not present

## 2021-02-24 DIAGNOSIS — E782 Mixed hyperlipidemia: Secondary | ICD-10-CM | POA: Diagnosis not present

## 2021-02-24 DIAGNOSIS — I10 Essential (primary) hypertension: Secondary | ICD-10-CM | POA: Diagnosis not present

## 2021-12-28 DIAGNOSIS — M8588 Other specified disorders of bone density and structure, other site: Secondary | ICD-10-CM | POA: Diagnosis not present

## 2021-12-28 DIAGNOSIS — M545 Low back pain, unspecified: Secondary | ICD-10-CM | POA: Diagnosis not present

## 2021-12-28 DIAGNOSIS — E782 Mixed hyperlipidemia: Secondary | ICD-10-CM | POA: Diagnosis not present

## 2021-12-28 DIAGNOSIS — I10 Essential (primary) hypertension: Secondary | ICD-10-CM | POA: Diagnosis not present

## 2022-01-02 ENCOUNTER — Other Ambulatory Visit: Payer: Self-pay | Admitting: Family Medicine

## 2022-01-02 DIAGNOSIS — E2839 Other primary ovarian failure: Secondary | ICD-10-CM

## 2022-06-18 DIAGNOSIS — E46 Unspecified protein-calorie malnutrition: Secondary | ICD-10-CM | POA: Diagnosis not present

## 2022-06-18 DIAGNOSIS — Z1331 Encounter for screening for depression: Secondary | ICD-10-CM | POA: Diagnosis not present

## 2022-06-18 DIAGNOSIS — F0394 Unspecified dementia, unspecified severity, with anxiety: Secondary | ICD-10-CM | POA: Diagnosis not present

## 2022-06-18 DIAGNOSIS — Z0000 Encounter for general adult medical examination without abnormal findings: Secondary | ICD-10-CM | POA: Diagnosis not present

## 2022-06-18 DIAGNOSIS — E782 Mixed hyperlipidemia: Secondary | ICD-10-CM | POA: Diagnosis not present

## 2022-06-18 DIAGNOSIS — I25119 Atherosclerotic heart disease of native coronary artery with unspecified angina pectoris: Secondary | ICD-10-CM | POA: Diagnosis not present

## 2022-06-18 DIAGNOSIS — I10 Essential (primary) hypertension: Secondary | ICD-10-CM | POA: Diagnosis not present

## 2022-06-29 ENCOUNTER — Ambulatory Visit
Admission: RE | Admit: 2022-06-29 | Discharge: 2022-06-29 | Disposition: A | Discharge status: Home / Self Care | Payer: Self-pay | Source: Ambulatory Visit | Attending: Family Medicine | Admitting: Family Medicine

## 2022-06-29 DIAGNOSIS — E2839 Other primary ovarian failure: Secondary | ICD-10-CM

## 2022-12-19 DIAGNOSIS — G309 Alzheimer's disease, unspecified: Secondary | ICD-10-CM | POA: Diagnosis not present

## 2022-12-19 DIAGNOSIS — F0280 Dementia in other diseases classified elsewhere without behavioral disturbance: Secondary | ICD-10-CM | POA: Diagnosis not present

## 2022-12-19 DIAGNOSIS — F02C0 Dementia in other diseases classified elsewhere, severe, without behavioral disturbance, psychotic disturbance, mood disturbance, and anxiety: Secondary | ICD-10-CM | POA: Diagnosis not present

## 2022-12-19 DIAGNOSIS — E782 Mixed hyperlipidemia: Secondary | ICD-10-CM | POA: Diagnosis not present

## 2022-12-19 DIAGNOSIS — I10 Essential (primary) hypertension: Secondary | ICD-10-CM | POA: Diagnosis not present

## 2022-12-19 DIAGNOSIS — R634 Abnormal weight loss: Secondary | ICD-10-CM | POA: Diagnosis not present

## 2022-12-19 DIAGNOSIS — Z23 Encounter for immunization: Secondary | ICD-10-CM | POA: Diagnosis not present

## 2022-12-31 DIAGNOSIS — G629 Polyneuropathy, unspecified: Secondary | ICD-10-CM | POA: Diagnosis not present

## 2022-12-31 DIAGNOSIS — M4800 Spinal stenosis, site unspecified: Secondary | ICD-10-CM | POA: Diagnosis not present

## 2022-12-31 DIAGNOSIS — I2510 Atherosclerotic heart disease of native coronary artery without angina pectoris: Secondary | ICD-10-CM | POA: Diagnosis not present

## 2022-12-31 DIAGNOSIS — K219 Gastro-esophageal reflux disease without esophagitis: Secondary | ICD-10-CM | POA: Diagnosis not present

## 2022-12-31 DIAGNOSIS — L89312 Pressure ulcer of right buttock, stage 2: Secondary | ICD-10-CM | POA: Diagnosis not present

## 2022-12-31 DIAGNOSIS — Z90710 Acquired absence of both cervix and uterus: Secondary | ICD-10-CM | POA: Diagnosis not present

## 2022-12-31 DIAGNOSIS — E782 Mixed hyperlipidemia: Secondary | ICD-10-CM | POA: Diagnosis not present

## 2022-12-31 DIAGNOSIS — Z981 Arthrodesis status: Secondary | ICD-10-CM | POA: Diagnosis not present

## 2022-12-31 DIAGNOSIS — M4310 Spondylolisthesis, site unspecified: Secondary | ICD-10-CM | POA: Diagnosis not present

## 2022-12-31 DIAGNOSIS — J45991 Cough variant asthma: Secondary | ICD-10-CM | POA: Diagnosis not present

## 2022-12-31 DIAGNOSIS — E46 Unspecified protein-calorie malnutrition: Secondary | ICD-10-CM | POA: Diagnosis not present

## 2022-12-31 DIAGNOSIS — M797 Fibromyalgia: Secondary | ICD-10-CM | POA: Diagnosis not present

## 2022-12-31 DIAGNOSIS — Z556 Problems related to health literacy: Secondary | ICD-10-CM | POA: Diagnosis not present

## 2022-12-31 DIAGNOSIS — M5136 Other intervertebral disc degeneration, lumbar region with discogenic back pain only: Secondary | ICD-10-CM | POA: Diagnosis not present

## 2022-12-31 DIAGNOSIS — F02C4 Dementia in other diseases classified elsewhere, severe, with anxiety: Secondary | ICD-10-CM | POA: Diagnosis not present

## 2022-12-31 DIAGNOSIS — M8580 Other specified disorders of bone density and structure, unspecified site: Secondary | ICD-10-CM | POA: Diagnosis not present

## 2022-12-31 DIAGNOSIS — D509 Iron deficiency anemia, unspecified: Secondary | ICD-10-CM | POA: Diagnosis not present

## 2022-12-31 DIAGNOSIS — I6529 Occlusion and stenosis of unspecified carotid artery: Secondary | ICD-10-CM | POA: Diagnosis not present

## 2022-12-31 DIAGNOSIS — I10 Essential (primary) hypertension: Secondary | ICD-10-CM | POA: Diagnosis not present

## 2022-12-31 DIAGNOSIS — G309 Alzheimer's disease, unspecified: Secondary | ICD-10-CM | POA: Diagnosis not present

## 2023-01-07 DIAGNOSIS — M79602 Pain in left arm: Secondary | ICD-10-CM | POA: Diagnosis not present

## 2023-01-07 DIAGNOSIS — M25512 Pain in left shoulder: Secondary | ICD-10-CM | POA: Diagnosis not present

## 2023-01-09 DIAGNOSIS — M797 Fibromyalgia: Secondary | ICD-10-CM | POA: Diagnosis not present

## 2023-01-09 DIAGNOSIS — M4800 Spinal stenosis, site unspecified: Secondary | ICD-10-CM | POA: Diagnosis not present

## 2023-01-09 DIAGNOSIS — F02C4 Dementia in other diseases classified elsewhere, severe, with anxiety: Secondary | ICD-10-CM | POA: Diagnosis not present

## 2023-01-09 DIAGNOSIS — M5136 Other intervertebral disc degeneration, lumbar region with discogenic back pain only: Secondary | ICD-10-CM | POA: Diagnosis not present

## 2023-01-09 DIAGNOSIS — E782 Mixed hyperlipidemia: Secondary | ICD-10-CM | POA: Diagnosis not present

## 2023-01-09 DIAGNOSIS — E46 Unspecified protein-calorie malnutrition: Secondary | ICD-10-CM | POA: Diagnosis not present

## 2023-01-09 DIAGNOSIS — G309 Alzheimer's disease, unspecified: Secondary | ICD-10-CM | POA: Diagnosis not present

## 2023-01-09 DIAGNOSIS — I10 Essential (primary) hypertension: Secondary | ICD-10-CM | POA: Diagnosis not present

## 2023-01-09 DIAGNOSIS — D509 Iron deficiency anemia, unspecified: Secondary | ICD-10-CM | POA: Diagnosis not present

## 2023-01-09 DIAGNOSIS — K219 Gastro-esophageal reflux disease without esophagitis: Secondary | ICD-10-CM | POA: Diagnosis not present

## 2023-01-09 DIAGNOSIS — I6529 Occlusion and stenosis of unspecified carotid artery: Secondary | ICD-10-CM | POA: Diagnosis not present

## 2023-01-09 DIAGNOSIS — G629 Polyneuropathy, unspecified: Secondary | ICD-10-CM | POA: Diagnosis not present

## 2023-01-09 DIAGNOSIS — M4310 Spondylolisthesis, site unspecified: Secondary | ICD-10-CM | POA: Diagnosis not present

## 2023-01-09 DIAGNOSIS — I2510 Atherosclerotic heart disease of native coronary artery without angina pectoris: Secondary | ICD-10-CM | POA: Diagnosis not present

## 2023-01-09 DIAGNOSIS — L89312 Pressure ulcer of right buttock, stage 2: Secondary | ICD-10-CM | POA: Diagnosis not present

## 2023-01-15 DIAGNOSIS — M4310 Spondylolisthesis, site unspecified: Secondary | ICD-10-CM | POA: Diagnosis not present

## 2023-01-15 DIAGNOSIS — L89312 Pressure ulcer of right buttock, stage 2: Secondary | ICD-10-CM | POA: Diagnosis not present

## 2023-01-15 DIAGNOSIS — M5136 Other intervertebral disc degeneration, lumbar region with discogenic back pain only: Secondary | ICD-10-CM | POA: Diagnosis not present

## 2023-01-15 DIAGNOSIS — G629 Polyneuropathy, unspecified: Secondary | ICD-10-CM | POA: Diagnosis not present

## 2023-01-15 DIAGNOSIS — I2510 Atherosclerotic heart disease of native coronary artery without angina pectoris: Secondary | ICD-10-CM | POA: Diagnosis not present

## 2023-01-15 DIAGNOSIS — M4800 Spinal stenosis, site unspecified: Secondary | ICD-10-CM | POA: Diagnosis not present

## 2023-01-15 DIAGNOSIS — M797 Fibromyalgia: Secondary | ICD-10-CM | POA: Diagnosis not present

## 2023-01-15 DIAGNOSIS — E782 Mixed hyperlipidemia: Secondary | ICD-10-CM | POA: Diagnosis not present

## 2023-01-15 DIAGNOSIS — G309 Alzheimer's disease, unspecified: Secondary | ICD-10-CM | POA: Diagnosis not present

## 2023-01-15 DIAGNOSIS — K219 Gastro-esophageal reflux disease without esophagitis: Secondary | ICD-10-CM | POA: Diagnosis not present

## 2023-01-15 DIAGNOSIS — I6529 Occlusion and stenosis of unspecified carotid artery: Secondary | ICD-10-CM | POA: Diagnosis not present

## 2023-01-15 DIAGNOSIS — D509 Iron deficiency anemia, unspecified: Secondary | ICD-10-CM | POA: Diagnosis not present

## 2023-01-15 DIAGNOSIS — I10 Essential (primary) hypertension: Secondary | ICD-10-CM | POA: Diagnosis not present

## 2023-01-15 DIAGNOSIS — F02C4 Dementia in other diseases classified elsewhere, severe, with anxiety: Secondary | ICD-10-CM | POA: Diagnosis not present

## 2023-01-15 DIAGNOSIS — E46 Unspecified protein-calorie malnutrition: Secondary | ICD-10-CM | POA: Diagnosis not present

## 2023-01-22 DIAGNOSIS — K219 Gastro-esophageal reflux disease without esophagitis: Secondary | ICD-10-CM | POA: Diagnosis not present

## 2023-01-22 DIAGNOSIS — I6529 Occlusion and stenosis of unspecified carotid artery: Secondary | ICD-10-CM | POA: Diagnosis not present

## 2023-01-22 DIAGNOSIS — M797 Fibromyalgia: Secondary | ICD-10-CM | POA: Diagnosis not present

## 2023-01-22 DIAGNOSIS — I10 Essential (primary) hypertension: Secondary | ICD-10-CM | POA: Diagnosis not present

## 2023-01-22 DIAGNOSIS — E782 Mixed hyperlipidemia: Secondary | ICD-10-CM | POA: Diagnosis not present

## 2023-01-22 DIAGNOSIS — G309 Alzheimer's disease, unspecified: Secondary | ICD-10-CM | POA: Diagnosis not present

## 2023-01-22 DIAGNOSIS — F02C4 Dementia in other diseases classified elsewhere, severe, with anxiety: Secondary | ICD-10-CM | POA: Diagnosis not present

## 2023-01-22 DIAGNOSIS — D509 Iron deficiency anemia, unspecified: Secondary | ICD-10-CM | POA: Diagnosis not present

## 2023-01-22 DIAGNOSIS — M4800 Spinal stenosis, site unspecified: Secondary | ICD-10-CM | POA: Diagnosis not present

## 2023-01-22 DIAGNOSIS — I2510 Atherosclerotic heart disease of native coronary artery without angina pectoris: Secondary | ICD-10-CM | POA: Diagnosis not present

## 2023-01-22 DIAGNOSIS — E46 Unspecified protein-calorie malnutrition: Secondary | ICD-10-CM | POA: Diagnosis not present

## 2023-01-22 DIAGNOSIS — M4310 Spondylolisthesis, site unspecified: Secondary | ICD-10-CM | POA: Diagnosis not present

## 2023-01-22 DIAGNOSIS — L89312 Pressure ulcer of right buttock, stage 2: Secondary | ICD-10-CM | POA: Diagnosis not present

## 2023-01-22 DIAGNOSIS — G629 Polyneuropathy, unspecified: Secondary | ICD-10-CM | POA: Diagnosis not present

## 2023-01-22 DIAGNOSIS — M5136 Other intervertebral disc degeneration, lumbar region with discogenic back pain only: Secondary | ICD-10-CM | POA: Diagnosis not present

## 2023-02-01 DIAGNOSIS — F02C4 Dementia in other diseases classified elsewhere, severe, with anxiety: Secondary | ICD-10-CM | POA: Diagnosis not present

## 2023-02-01 DIAGNOSIS — Z90710 Acquired absence of both cervix and uterus: Secondary | ICD-10-CM | POA: Diagnosis not present

## 2023-02-01 DIAGNOSIS — M5136 Other intervertebral disc degeneration, lumbar region with discogenic back pain only: Secondary | ICD-10-CM | POA: Diagnosis not present

## 2023-02-01 DIAGNOSIS — E46 Unspecified protein-calorie malnutrition: Secondary | ICD-10-CM | POA: Diagnosis not present

## 2023-02-01 DIAGNOSIS — J45991 Cough variant asthma: Secondary | ICD-10-CM | POA: Diagnosis not present

## 2023-02-01 DIAGNOSIS — M8580 Other specified disorders of bone density and structure, unspecified site: Secondary | ICD-10-CM | POA: Diagnosis not present

## 2023-02-01 DIAGNOSIS — Z981 Arthrodesis status: Secondary | ICD-10-CM | POA: Diagnosis not present

## 2023-02-01 DIAGNOSIS — K219 Gastro-esophageal reflux disease without esophagitis: Secondary | ICD-10-CM | POA: Diagnosis not present

## 2023-02-01 DIAGNOSIS — G309 Alzheimer's disease, unspecified: Secondary | ICD-10-CM | POA: Diagnosis not present

## 2023-02-01 DIAGNOSIS — M797 Fibromyalgia: Secondary | ICD-10-CM | POA: Diagnosis not present

## 2023-02-01 DIAGNOSIS — M4800 Spinal stenosis, site unspecified: Secondary | ICD-10-CM | POA: Diagnosis not present

## 2023-02-01 DIAGNOSIS — I2510 Atherosclerotic heart disease of native coronary artery without angina pectoris: Secondary | ICD-10-CM | POA: Diagnosis not present

## 2023-02-01 DIAGNOSIS — D509 Iron deficiency anemia, unspecified: Secondary | ICD-10-CM | POA: Diagnosis not present

## 2023-02-01 DIAGNOSIS — M4310 Spondylolisthesis, site unspecified: Secondary | ICD-10-CM | POA: Diagnosis not present

## 2023-02-01 DIAGNOSIS — I10 Essential (primary) hypertension: Secondary | ICD-10-CM | POA: Diagnosis not present

## 2023-02-01 DIAGNOSIS — L89312 Pressure ulcer of right buttock, stage 2: Secondary | ICD-10-CM | POA: Diagnosis not present

## 2023-02-01 DIAGNOSIS — G629 Polyneuropathy, unspecified: Secondary | ICD-10-CM | POA: Diagnosis not present

## 2023-02-01 DIAGNOSIS — I6529 Occlusion and stenosis of unspecified carotid artery: Secondary | ICD-10-CM | POA: Diagnosis not present

## 2023-02-01 DIAGNOSIS — E782 Mixed hyperlipidemia: Secondary | ICD-10-CM | POA: Diagnosis not present

## 2023-02-01 DIAGNOSIS — Z556 Problems related to health literacy: Secondary | ICD-10-CM | POA: Diagnosis not present

## 2023-02-15 DIAGNOSIS — D509 Iron deficiency anemia, unspecified: Secondary | ICD-10-CM | POA: Diagnosis not present

## 2023-02-15 DIAGNOSIS — M4800 Spinal stenosis, site unspecified: Secondary | ICD-10-CM | POA: Diagnosis not present

## 2023-02-15 DIAGNOSIS — M797 Fibromyalgia: Secondary | ICD-10-CM | POA: Diagnosis not present

## 2023-02-15 DIAGNOSIS — M5136 Other intervertebral disc degeneration, lumbar region with discogenic back pain only: Secondary | ICD-10-CM | POA: Diagnosis not present

## 2023-02-15 DIAGNOSIS — F02C4 Dementia in other diseases classified elsewhere, severe, with anxiety: Secondary | ICD-10-CM | POA: Diagnosis not present

## 2023-02-15 DIAGNOSIS — G309 Alzheimer's disease, unspecified: Secondary | ICD-10-CM | POA: Diagnosis not present

## 2023-02-15 DIAGNOSIS — E46 Unspecified protein-calorie malnutrition: Secondary | ICD-10-CM | POA: Diagnosis not present

## 2023-02-15 DIAGNOSIS — K219 Gastro-esophageal reflux disease without esophagitis: Secondary | ICD-10-CM | POA: Diagnosis not present

## 2023-02-15 DIAGNOSIS — E782 Mixed hyperlipidemia: Secondary | ICD-10-CM | POA: Diagnosis not present

## 2023-02-15 DIAGNOSIS — M4310 Spondylolisthesis, site unspecified: Secondary | ICD-10-CM | POA: Diagnosis not present

## 2023-02-15 DIAGNOSIS — I2510 Atherosclerotic heart disease of native coronary artery without angina pectoris: Secondary | ICD-10-CM | POA: Diagnosis not present

## 2023-02-15 DIAGNOSIS — I10 Essential (primary) hypertension: Secondary | ICD-10-CM | POA: Diagnosis not present

## 2023-02-15 DIAGNOSIS — I6529 Occlusion and stenosis of unspecified carotid artery: Secondary | ICD-10-CM | POA: Diagnosis not present

## 2023-02-15 DIAGNOSIS — G629 Polyneuropathy, unspecified: Secondary | ICD-10-CM | POA: Diagnosis not present

## 2023-02-15 DIAGNOSIS — L89312 Pressure ulcer of right buttock, stage 2: Secondary | ICD-10-CM | POA: Diagnosis not present

## 2023-02-22 DIAGNOSIS — F02C4 Dementia in other diseases classified elsewhere, severe, with anxiety: Secondary | ICD-10-CM | POA: Diagnosis not present

## 2023-02-22 DIAGNOSIS — M4310 Spondylolisthesis, site unspecified: Secondary | ICD-10-CM | POA: Diagnosis not present

## 2023-02-22 DIAGNOSIS — I10 Essential (primary) hypertension: Secondary | ICD-10-CM | POA: Diagnosis not present

## 2023-02-22 DIAGNOSIS — D509 Iron deficiency anemia, unspecified: Secondary | ICD-10-CM | POA: Diagnosis not present

## 2023-02-22 DIAGNOSIS — E782 Mixed hyperlipidemia: Secondary | ICD-10-CM | POA: Diagnosis not present

## 2023-02-22 DIAGNOSIS — I2510 Atherosclerotic heart disease of native coronary artery without angina pectoris: Secondary | ICD-10-CM | POA: Diagnosis not present

## 2023-02-22 DIAGNOSIS — I6529 Occlusion and stenosis of unspecified carotid artery: Secondary | ICD-10-CM | POA: Diagnosis not present

## 2023-02-22 DIAGNOSIS — L89312 Pressure ulcer of right buttock, stage 2: Secondary | ICD-10-CM | POA: Diagnosis not present

## 2023-02-22 DIAGNOSIS — M5136 Other intervertebral disc degeneration, lumbar region with discogenic back pain only: Secondary | ICD-10-CM | POA: Diagnosis not present

## 2023-02-22 DIAGNOSIS — M797 Fibromyalgia: Secondary | ICD-10-CM | POA: Diagnosis not present

## 2023-02-22 DIAGNOSIS — K219 Gastro-esophageal reflux disease without esophagitis: Secondary | ICD-10-CM | POA: Diagnosis not present

## 2023-02-22 DIAGNOSIS — E538 Deficiency of other specified B group vitamins: Secondary | ICD-10-CM | POA: Diagnosis not present

## 2023-02-22 DIAGNOSIS — E46 Unspecified protein-calorie malnutrition: Secondary | ICD-10-CM | POA: Diagnosis not present

## 2023-02-22 DIAGNOSIS — G309 Alzheimer's disease, unspecified: Secondary | ICD-10-CM | POA: Diagnosis not present

## 2023-02-22 DIAGNOSIS — G629 Polyneuropathy, unspecified: Secondary | ICD-10-CM | POA: Diagnosis not present

## 2023-02-22 DIAGNOSIS — M4800 Spinal stenosis, site unspecified: Secondary | ICD-10-CM | POA: Diagnosis not present

## 2023-02-26 DIAGNOSIS — M5136 Other intervertebral disc degeneration, lumbar region with discogenic back pain only: Secondary | ICD-10-CM | POA: Diagnosis not present

## 2023-02-26 DIAGNOSIS — M797 Fibromyalgia: Secondary | ICD-10-CM | POA: Diagnosis not present

## 2023-02-26 DIAGNOSIS — G309 Alzheimer's disease, unspecified: Secondary | ICD-10-CM | POA: Diagnosis not present

## 2023-02-26 DIAGNOSIS — D509 Iron deficiency anemia, unspecified: Secondary | ICD-10-CM | POA: Diagnosis not present

## 2023-02-26 DIAGNOSIS — I10 Essential (primary) hypertension: Secondary | ICD-10-CM | POA: Diagnosis not present

## 2023-02-26 DIAGNOSIS — F02C4 Dementia in other diseases classified elsewhere, severe, with anxiety: Secondary | ICD-10-CM | POA: Diagnosis not present

## 2023-02-26 DIAGNOSIS — E782 Mixed hyperlipidemia: Secondary | ICD-10-CM | POA: Diagnosis not present

## 2023-02-26 DIAGNOSIS — G629 Polyneuropathy, unspecified: Secondary | ICD-10-CM | POA: Diagnosis not present

## 2023-02-26 DIAGNOSIS — M4310 Spondylolisthesis, site unspecified: Secondary | ICD-10-CM | POA: Diagnosis not present

## 2023-02-26 DIAGNOSIS — M4800 Spinal stenosis, site unspecified: Secondary | ICD-10-CM | POA: Diagnosis not present

## 2023-02-26 DIAGNOSIS — I6529 Occlusion and stenosis of unspecified carotid artery: Secondary | ICD-10-CM | POA: Diagnosis not present

## 2023-02-26 DIAGNOSIS — I2510 Atherosclerotic heart disease of native coronary artery without angina pectoris: Secondary | ICD-10-CM | POA: Diagnosis not present

## 2023-02-26 DIAGNOSIS — K219 Gastro-esophageal reflux disease without esophagitis: Secondary | ICD-10-CM | POA: Diagnosis not present

## 2023-02-26 DIAGNOSIS — L89312 Pressure ulcer of right buttock, stage 2: Secondary | ICD-10-CM | POA: Diagnosis not present

## 2023-02-26 DIAGNOSIS — E46 Unspecified protein-calorie malnutrition: Secondary | ICD-10-CM | POA: Diagnosis not present

## 2023-10-18 DIAGNOSIS — E782 Mixed hyperlipidemia: Secondary | ICD-10-CM | POA: Diagnosis not present

## 2023-10-18 DIAGNOSIS — Z1331 Encounter for screening for depression: Secondary | ICD-10-CM | POA: Diagnosis not present

## 2023-10-18 DIAGNOSIS — Z23 Encounter for immunization: Secondary | ICD-10-CM | POA: Diagnosis not present

## 2023-10-18 DIAGNOSIS — Z0000 Encounter for general adult medical examination without abnormal findings: Secondary | ICD-10-CM | POA: Diagnosis not present
